# Patient Record
Sex: Male | Born: 1975 | Race: White | Hispanic: No | Marital: Single | Smoking: Current every day smoker
Health system: Southern US, Community
[De-identification: ages and names within clinical notes are randomized; demographics above are authoritative.]

## PROBLEM LIST (undated history)

## (undated) DIAGNOSIS — F32A Depression, unspecified: Secondary | ICD-10-CM

## (undated) DIAGNOSIS — F845 Asperger's syndrome: Secondary | ICD-10-CM

## (undated) DIAGNOSIS — F1010 Alcohol abuse, uncomplicated: Secondary | ICD-10-CM

## (undated) DIAGNOSIS — E785 Hyperlipidemia, unspecified: Secondary | ICD-10-CM

## (undated) DIAGNOSIS — F17200 Nicotine dependence, unspecified, uncomplicated: Secondary | ICD-10-CM

## (undated) HISTORY — DX: Depression, unspecified: F32.A

## (undated) HISTORY — DX: Hyperlipidemia, unspecified: E78.5

## (undated) HISTORY — PX: TONSILLECTOMY: SUR1361

## (undated) HISTORY — DX: Nicotine dependence, unspecified, uncomplicated: F17.200

---

## 2011-06-16 ENCOUNTER — Ambulatory Visit: Payer: BC Managed Care – PPO

## 2011-06-16 ENCOUNTER — Ambulatory Visit (INDEPENDENT_AMBULATORY_CARE_PROVIDER_SITE_OTHER): Payer: BC Managed Care – PPO | Admitting: Family Medicine

## 2011-06-16 VITALS — BP 108/70 | HR 93 | Temp 98.0°F | Resp 18 | Ht 68.0 in | Wt 138.2 lb

## 2011-06-16 DIAGNOSIS — R634 Abnormal weight loss: Secondary | ICD-10-CM

## 2011-06-16 DIAGNOSIS — R05 Cough: Secondary | ICD-10-CM

## 2011-06-16 DIAGNOSIS — Z720 Tobacco use: Secondary | ICD-10-CM

## 2011-06-16 DIAGNOSIS — R350 Frequency of micturition: Secondary | ICD-10-CM

## 2011-06-16 DIAGNOSIS — F17200 Nicotine dependence, unspecified, uncomplicated: Secondary | ICD-10-CM

## 2011-06-16 LAB — POCT URINALYSIS DIPSTICK
Bilirubin, UA: NEGATIVE
Blood, UA: NEGATIVE
Glucose, UA: NEGATIVE
Leukocytes, UA: NEGATIVE
Nitrite, UA: NEGATIVE
Protein, UA: NEGATIVE
Spec Grav, UA: 1.02
Urobilinogen, UA: 1
pH, UA: 7

## 2011-06-16 LAB — COMPREHENSIVE METABOLIC PANEL
Albumin: 4.4 g/dL (ref 3.5–5.2)
Alkaline Phosphatase: 57 U/L (ref 39–117)
BUN: 14 mg/dL (ref 6–23)
CO2: 33 mEq/L — ABNORMAL HIGH (ref 19–32)
Glucose, Bld: 78 mg/dL (ref 70–99)
Sodium: 141 mEq/L (ref 135–145)
Total Bilirubin: 0.3 mg/dL (ref 0.3–1.2)
Total Protein: 6.8 g/dL (ref 6.0–8.3)

## 2011-06-16 LAB — POCT CBC
HCT, POC: 45.4 % (ref 43.5–53.7)
Hemoglobin: 15.1 g/dL (ref 14.1–18.1)
Lymph, poc: 3.5 — AB (ref 0.6–3.4)
MCHC: 33.3 g/dL (ref 31.8–35.4)
MCV: 91.7 fL (ref 80–97)
POC Granulocyte: 6.2 (ref 2–6.9)
POC LYMPH PERCENT: 32.9 %L (ref 10–50)
RDW, POC: 13.7 %
WBC: 10.6 10*3/uL — AB (ref 4.6–10.2)

## 2011-06-16 LAB — TSH: TSH: 0.938 u[IU]/mL (ref 0.350–4.500)

## 2011-06-16 LAB — GLUCOSE, POCT (MANUAL RESULT ENTRY): POC Glucose: 77

## 2011-06-16 LAB — POCT SEDIMENTATION RATE: POCT SED RATE: 5 mm/hr (ref 0–22)

## 2011-06-16 NOTE — Patient Instructions (Signed)
Increase your caloric intake, with increased portions, mid morning and afternoon snack, and balanced diet including protein, fiber and complex carbohydrates.  Recheck in next 6 weeks, and can repeat blood counts at that time.  Return to the clinic or go to the nearest emergency room if any of your symptoms worsen or new symptoms occur, including any fever.  Your other lab results and xray report will be available in the next 7 to 10 days.

## 2011-06-16 NOTE — Progress Notes (Signed)
Subjective:    Patient ID: Christian Haley, male    DOB: Mar 30, 1975, 36 y.o.   MRN: [id number]  HPI Christian Haley is a 36 y.o. male Last office visit here 04/03/07.  At that time was having approximately Q 2 week drug screens as had history of alcohol dependence and was in vocational rehab with psychotherapist.    Today, patient with concern of weight loss, family mentioned this few days ago.  Weight 164.8 at 04/03/07 office visit *(138 today).  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 20-25 pounds weight loss over past few years. Sleeping well.  No night sweats, no fevers.  Occasional cough - few times per day, past several years.  Has had some pollen allergies in past. usually dry, no hemoptysis, rare mucus. Feels well overall.  Eats frozen foods mostly.  3 meals per day.  No recent dieting. Lives by self.  No recent alcohol - none since 2009. Prior heavy use.  Smokes 1 ppd for 20 years. No illicit drug use.   Rare aspirin for headache. 4 -5 cups coffee per day.  Has urinary frequency with increased coffee consumption, but no urinary symptoms otherwise.  Hx of autism/ high functioning (Aspergers).   Review of Systems  Constitutional: Positive for unexpected weight change. Negative for fever, chills, diaphoresis and fatigue.  HENT: Negative for hearing loss and trouble swallowing.   Eyes: Negative for visual disturbance.  Respiratory: Positive for cough. Negative for shortness of breath.   Cardiovascular: Negative for chest pain.  Gastrointestinal: Negative for abdominal pain, blood in stool and anal bleeding.  Genitourinary: Positive for frequency. Negative for difficulty urinating.  Hematological: Negative for adenopathy. Does not bruise/bleed easily.  Psychiatric/Behavioral: Negative for suicidal ideas, sleep disturbance and dysphoric mood. The patient is not nervous/anxious.        Objective:   Physical Exam  Constitutional: He is  oriented to person, place, and time. He appears well-developed. No distress.       Appears thin.  No distress.  HENT:  Head: Normocephalic and atraumatic.  Right Ear: External ear normal.  Left Ear: External ear normal.  Mouth/Throat: Oropharynx is clear and moist. No oropharyngeal exudate.  Eyes: Conjunctivae and EOM are normal. Pupils are equal, round, and reactive to light.  Neck: Normal range of motion. No thyromegaly present.  Cardiovascular: Normal rate, regular rhythm, normal heart sounds and intact distal pulses.   Pulmonary/Chest: Effort normal and breath sounds normal. No respiratory distress. He has no wheezes.  Abdominal: Soft. Normal appearance and bowel sounds are normal. He exhibits no distension. There is no hepatosplenomegaly. There is no tenderness. There is no rigidity and no guarding.  Musculoskeletal: He exhibits no edema.  Lymphadenopathy:    He has no cervical adenopathy.  Neurological: He is alert and oriented to person, place, and time.  Skin: Skin is warm and dry. No rash noted. No erythema. No pallor.  Psychiatric: He has a normal mood and affect. His behavior is normal.       Stutters during exam , but no acute findings.   [facility] reading (PRIMARY) by  Dr. Neva Seat: increased RLL>LLL markings without focal infiltrate.  Results for orders placed in visit on 06/16/11  POCT CBC      Component Value Range   WBC 10.6 (*) 4.6 - 10.2 (K/uL)   Lymph, poc 3.5 (*) 0.6 - 3.4    POC LYMPH PERCENT 32.9  10 - 50 (%L)   MID (  cbc) 1.0 (*) 0 - 0.9    POC MID % 9.0  0 - 12 (%M)   POC Granulocyte 6.2  2 - 6.9    Granulocyte percent 58.1  37 - 80 (%G)   RBC 4.95  4.69 - 6.13 (M/uL)   Hemoglobin 15.1  14.1 - 18.1 (g/dL)   HCT, POC 96.0  45.4 - 53.7 (%)   MCV 91.7  80 - 97 (fL)   MCH, POC 30.5  27 - 31.2 (pg)   MCHC 33.3  31.8 - 35.4 (g/dL)   RDW, POC 09.8     Platelet Count, POC 403  142 - 424 (K/uL)   MPV 8.4  0 - 99.8 (fL)  POCT URINALYSIS DIPSTICK      Component Value  Range   Color, UA yellow     Clarity, UA clear     Glucose, UA neg     Bilirubin, UA neg     Ketones, UA trace     Spec Grav, UA 1.020     Blood, UA neg     pH, UA 7.0     Protein, UA neg     Urobilinogen, UA 1.0     Nitrite, UA neg     Leukocytes, UA Negative    GLUCOSE, POCT (MANUAL RESULT ENTRY)      Component Value Range   POC Glucose 77         Assessment & Plan:  Christian Haley is a 36 y.o. male 1. Weight loss, unintentional  POCT CBC, Comprehensive metabolic panel, POCT SEDIMENTATION RATE, TSH, POCT glucose (manual entry)  2. Cough  DG Chest 2 View  3. Tobacco abuse  DG Chest 2 View  4. Urinary frequency  Comprehensive metabolic panel, POCT urinalysis dipstick, POCT glucose (manual entry)   Check CXR report with smoking history.  Sed rate, CMP, TSH.    Plan on increased caloric intake, with increased portions, mid morning and afternoon snack, and balanced diet including protein, fiber and complex carbohydrates.  Recheck in next 6 weeks, and can repeat CBC at that time.  RTC sooner if any new or worsening symptoms, including fever.  Recheck next 6 weeks.

## 2011-07-28 ENCOUNTER — Ambulatory Visit (INDEPENDENT_AMBULATORY_CARE_PROVIDER_SITE_OTHER): Payer: BC Managed Care – PPO | Admitting: Family Medicine

## 2011-07-28 VITALS — BP 96/62 | HR 99 | Temp 97.9°F | Resp 20 | Ht 68.38 in | Wt 140.6 lb

## 2011-07-28 DIAGNOSIS — F17200 Nicotine dependence, unspecified, uncomplicated: Secondary | ICD-10-CM

## 2011-07-28 DIAGNOSIS — R634 Abnormal weight loss: Secondary | ICD-10-CM

## 2011-07-28 DIAGNOSIS — Z720 Tobacco use: Secondary | ICD-10-CM

## 2011-07-28 DIAGNOSIS — D72829 Elevated white blood cell count, unspecified: Secondary | ICD-10-CM

## 2011-07-28 LAB — POCT CBC
Granulocyte percent: 58.5 %G (ref 37–80)
HCT, POC: 46.8 % (ref 43.5–53.7)
Hemoglobin: 15.2 g/dL (ref 14.1–18.1)
MPV: 7.7 fL (ref 0–99.8)
POC Granulocyte: 6.5 (ref 2–6.9)
POC MID %: 8.5 %M (ref 0–12)
RBC: 5.04 M/uL (ref 4.69–6.13)

## 2011-07-28 NOTE — Progress Notes (Signed)
Subjective:    Patient ID: Christian Haley, male    DOB: [birth year], 36 y.o.   MRN: [id number]  HPI Christian Haley is a 35 y.o. male Seen 06/16/11 with weight loss.  In summary - Weight 164.8 at 04/03/07 office visit *(138 last office visit).  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 20-25 pounds weight loss over past few years. Sleeping well.  No night sweats, no fevers.  Occasional cough - few times per day, past several years.  Has had some pollen allergies in past. usually dry, no hemoptysis, rare mucus. Feels well overall. Eats frozen foods mostly.  3 meals per day.  No recent dieting. Lives by self.  No recent alcohol - none since 2009. Prior heavy use.  Smokes 1 ppd for 20 years. No illicit drug use. Rare aspirin for headache. 4 -5 cups coffee per day.  Has urinary frequency with increased coffee consumption, but no urinary symptoms otherwise.   CXR report (smoking history:   Findings: Lung volumes are normal. No consolidative airspace  disease. No pleural effusions. No pneumothorax. No pulmonary  nodule or mass noted. Pulmonary vasculature and the  cardiomediastinal silhouette are within normal limits.  IMPRESSION:  1. No radiographic evidence of acute cardiopulmonary disease.   Sed rate, CMP, TSH drawn last ov :  Results for orders placed in visit on 06/16/11  POCT CBC      Component Value Range   WBC 10.6 (*) 4.6 - 10.2 (K/uL)   Lymph, poc 3.5 (*) 0.6 - 3.4    POC LYMPH PERCENT 32.9  10 - 50 (%L)   MID (cbc) 1.0 (*) 0 - 0.9    POC MID % 9.0  0 - 12 (%M)   POC Granulocyte 6.2  2 - 6.9    Granulocyte percent 58.1  37 - 80 (%G)   RBC 4.95  4.69 - 6.13 (M/uL)   Hemoglobin 15.1  14.1 - 18.1 (g/dL)   HCT, POC 40.9  81.1 - 53.7 (%)   MCV 91.7  80 - 97 (fL)   MCH, POC 30.5  27 - 31.2 (pg)   MCHC 33.3  31.8 - 35.4 (g/dL)   RDW, POC 91.4     Platelet Count, POC 403  142 - 424 (K/uL)   MPV 8.4  0 - 99.8 (fL)  COMPREHENSIVE METABOLIC  PANEL      Component Value Range   Sodium 141  135 - 145 (mEq/L)   Potassium 4.4  3.5 - 5.3 (mEq/L)   Chloride 101  96 - 112 (mEq/L)   CO2 33 (*) 19 - 32 (mEq/L)   Glucose, Bld 78  70 - 99 (mg/dL)   BUN 14  6 - 23 (mg/dL)   Creat 7.82  9.56 - 2.13 (mg/dL)   Total Bilirubin 0.3  0.3 - 1.2 (mg/dL)   Alkaline Phosphatase 57  39 - 117 (U/L)   AST 14  0 - 37 (U/L)   ALT 8  0 - 53 (U/L)   Total Protein 6.8  6.0 - 8.3 (g/dL)   Albumin 4.4  3.5 - 5.2 (g/dL)   Calcium 9.5  8.4 - 08.6 (mg/dL)  POCT SEDIMENTATION RATE      Component Value Range   POCT SED RATE 5  0 - 22 (mm/hr)  TSH      Component Value Range   TSH 0.938  0.350 - 4.500 (uIU/mL)  POCT URINALYSIS DIPSTICK      Component  Value Range   Color, UA yellow     Clarity, UA clear     Glucose, UA neg     Bilirubin, UA neg     Ketones, UA trace     Spec Grav, UA 1.020     Blood, UA neg     pH, UA 7.0     Protein, UA neg     Urobilinogen, UA 1.0     Nitrite, UA neg     Leukocytes, UA Negative    GLUCOSE, POCT (MANUAL RESULT ENTRY)      Component Value Range   POC Glucose 77     Planned on increased caloric intake, with increased portions, mid morning and afternoon snack, and balanced diet including protein, fiber and complex carbohydrates.   Her for recheck with repeat CBC.  Now eating 2 granola bars per day, no other changes.  Feels better with this. Has not increased calories.  Occasional fast food.  Occasional eggs in am, usually in fruit or microwaveable sausage biscuit.    Hx of high functioning Aspergers syndrome.  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   No recent illness.    Review of Systems  Constitutional: Negative for fever, chills and appetite change.  Respiratory: Negative for cough and shortness of breath.   Cardiovascular: Negative for chest pain.  Neurological: Negative for headaches.       Objective:   Physical Exam  Constitutional: He is oriented to person, place, and time. No distress.        Appears thin.  No distress.  HENT:  Head: Normocephalic and atraumatic.  Cardiovascular: Normal rate, regular rhythm, normal heart sounds and intact distal pulses.   No murmur heard. Pulmonary/Chest: Effort normal.  Neurological: He is alert and oriented to person, place, and time.       nonfocal.  Skin: Skin is warm and dry.  Psychiatric: He has a normal mood and affect. His behavior is normal.    Results for orders placed in visit on 07/28/11  POCT CBC      Component Value Range   WBC 11.1 (*) 4.6 - 10.2 (K/uL)   Lymph, poc 3.7 (*) 0.6 - 3.4    POC LYMPH PERCENT 33.0  10 - 50 (%L)   MID (cbc) 0.9  0 - 0.9    POC MID % 8.5  0 - 12 (%M)   POC Granulocyte 6.5  2 - 6.9    Granulocyte percent 58.5  37 - 80 (%G)   RBC 5.04  4.69 - 6.13 (M/uL)   Hemoglobin 15.2  14.1 - 18.1 (g/dL)   HCT, POC 16.1  09.6 - 53.7 (%)   MCV 92.8  80 - 97 (fL)   MCH, POC 30.2  27 - 31.2 (pg)   MCHC 32.5  31.8 - 35.4 (g/dL)   RDW, POC 04.5     Platelet Count, POC 378  142 - 424 (K/uL)   MPV 7.7  0 - 99.8 (fL)         Assessment & Plan:  [name] is a 36 y.o. male Wt loss, only up 2 pounds from last office visit, as only change is 2 granola bars.  Again discussed necessary caloric intake with his high activity job, and more nutritious diet in general with less fast food and processed/frozen food.  Recommended nutritionist consult, but he declined at this time.  Discussed if he changes his mind, we can refer there. He stated he would like to keep  his diet the way it is for now.  Recommended less processsed and fast food, more complex carbs and total calories. Discussed recheck in next 2-3 months. He plans on calling about this.   Borderline leukocytosis - check peripheral smear for review.  Tobacco abuse - still smoking 1 pack per day. No intention on quitting currently - advised him of dangers of smoking, and that we will be happy to help him with this when he is ready.

## 2011-07-29 LAB — PATHOLOGIST SMEAR REVIEW

## 2011-07-31 ENCOUNTER — Telehealth: Payer: Self-pay

## 2011-07-31 NOTE — Telephone Encounter (Signed)
Patient states he was returning call to [place]. Please call back.

## 2011-10-08 ENCOUNTER — Ambulatory Visit (INDEPENDENT_AMBULATORY_CARE_PROVIDER_SITE_OTHER): Payer: BC Managed Care – PPO | Admitting: Family Medicine

## 2011-10-08 ENCOUNTER — Encounter: Payer: Self-pay | Admitting: Family Medicine

## 2011-10-08 VITALS — BP 98/64 | HR 93 | Temp 98.3°F | Resp 16 | Ht 68.0 in | Wt 140.4 lb

## 2011-10-08 DIAGNOSIS — D7289 Other specified disorders of white blood cells: Secondary | ICD-10-CM

## 2011-10-08 DIAGNOSIS — D72829 Elevated white blood cell count, unspecified: Secondary | ICD-10-CM

## 2011-10-08 DIAGNOSIS — R634 Abnormal weight loss: Secondary | ICD-10-CM

## 2011-10-08 LAB — CBC WITH DIFFERENTIAL/PLATELET
Basophils Relative: 1 % (ref 0–1)
HCT: 44.1 % (ref 39.0–52.0)
Hemoglobin: 15.7 g/dL (ref 13.0–17.0)
Lymphocytes Relative: 29 % (ref 12–46)
Lymphs Abs: 2.8 10*3/uL (ref 0.7–4.0)
MCHC: 35.6 g/dL (ref 30.0–36.0)
Monocytes Relative: 8 % (ref 3–12)
Neutro Abs: 5.4 10*3/uL (ref 1.7–7.7)
Neutrophils Relative %: 55 % (ref 43–77)
RBC: 5.03 MIL/uL (ref 4.22–5.81)
WBC: 9.7 10*3/uL (ref 4.0–10.5)

## 2011-10-08 NOTE — Patient Instructions (Signed)
Continue to maintain good hydration and adequate calories to support your metabolism, including complex carbohydrates/fiber. Recheck in 5 months. Return to the clinic or go to the nearest emergency room if any of your symptoms worsen or new symptoms occur.

## 2011-10-08 NOTE — Progress Notes (Signed)
  Subjective:    Patient ID: Christian Haley, male    DOB: Sep 29, 1975, 36 y.o.   MRN: [id number]  HPI Christian Haley is a 36 y.o. male See last 2 ov's:  Weight loss - 164 in 2009, 138, then 140 at 5/13 office visit.  Active job, suspected too few calories to support.  Working on diet changes then. CMP, TSh, and CXR (hx of tobacco abuse ) WNL.  Has been able to cook more on own instead of frozen foods.  Feels better - more energy, eating healthier.  Cooking more seems to be working well.     Leukocytosis - 06/16/11: WBC 10.6, 07/28/11: 11.1, path review WNL, no fevers. No new symptoms.  No fever, no cough/hemoptysis/SOB.  NO dysuria or urinary difficulties. No new swollen lymph nodes.     Review of Systems  Hematological: Negative for adenopathy.   As above no new concerns.      Objective:   Physical Exam  Constitutional: He is oriented to person, place, and time. He appears well-developed and well-nourished.  HENT:  Head: Normocephalic.  Eyes: Pupils are equal, round, and reactive to light.  Neck: Normal range of motion. No thyromegaly present.  Cardiovascular: Normal rate, regular rhythm, normal heart sounds and intact distal pulses.   Pulmonary/Chest: Effort normal.  Abdominal: Soft. There is no tenderness.  Lymphadenopathy:    He has no cervical adenopathy.  Neurological: He is alert and oriented to person, place, and time.  Skin: Skin is warm.       No cervical, axillary, epitrochlear, or inguinal lymphadenopathy.   Psychiatric: He has a normal mood and affect. His behavior is normal.          Assessment & Plan:  Christian Haley is a 36 y.o. male 1. Weight loss    2. Leukocytosis  CBC with Differential   Wt loss - stable. Improved diet/healthier diet.  Continue to maintain adequate calorie intake to support active job, including complex carbs.    Leukocytosis - no fever or known infection.  Recheck level today.  Discussed maintaining hydration.   rtc precautions.

## 2012-03-10 ENCOUNTER — Ambulatory Visit: Payer: BC Managed Care – PPO | Admitting: Family Medicine

## 2012-03-13 ENCOUNTER — Ambulatory Visit (INDEPENDENT_AMBULATORY_CARE_PROVIDER_SITE_OTHER): Payer: BC Managed Care – PPO | Admitting: Family Medicine

## 2012-03-13 ENCOUNTER — Encounter: Payer: Self-pay | Admitting: Family Medicine

## 2012-03-13 VITALS — BP 100/62 | HR 87 | Temp 98.9°F | Resp 16 | Ht 68.5 in | Wt 139.0 lb

## 2012-03-13 DIAGNOSIS — Z720 Tobacco use: Secondary | ICD-10-CM

## 2012-03-13 DIAGNOSIS — Z23 Encounter for immunization: Secondary | ICD-10-CM

## 2012-03-13 DIAGNOSIS — Z713 Dietary counseling and surveillance: Secondary | ICD-10-CM

## 2012-03-13 DIAGNOSIS — F17200 Nicotine dependence, unspecified, uncomplicated: Secondary | ICD-10-CM

## 2012-03-13 NOTE — Patient Instructions (Signed)
Continue to work on diet changes as we discussed for your energy at work and health overall.   Decrease fast food and [employer]. Sandwich and vegetable at deli is a better option.  Increase complex carbohydrates with wheat breads and other fiber containing foods.  Eat snacks and increase in calories when increased activity or working more hours. Increase vegetables other than salad or lettuce.  Goal of 145 weight at next 4-5 months.  When you are ready to quit smoking, we are ready to help you with this.  It is recommended to quit smoking for your overall health and to decrease the risk for problems  including risks of cancers with smoking.   Recheck in the next 4-5 months.

## 2012-03-13 NOTE — Progress Notes (Signed)
Subjective:    Patient ID: Christian Haley, male    DOB: [birth year], 36 y.o.   MRN: [id number]  HPI  See prior ov's:  Weight loss - 164 in 2009, 138, then 140 at 5/13 office visit. Active job, suspected too few calories to support. Worked on diet changes then. CMP, TSh, and CXR (hx of tobacco abuse ) WNL. Had been able to cook more on own instead of frozen foods. Felt better - more energy, eating healthier. Cooking more seemed to be working well.  Improved with better calorie intake and discussions of more nutritious foods and complex carbs. Wt 140 last ov. Discussed nutritionist eval prior - but pt declined.   Weight 139 today - still working at [employer]. Busy at work.  Was able to spend thanksgiving and Christmas with family. Still cooking at home.  Ground beef, eggs, bacon and sausage.  Salad or lettuce usually as vegetable, some fruits at times. Potato rolls. Occasional wheat, eating more cereal. Eats [employer] at work during break.  Usually eating burgers and chicken sandwiches, french fries most of the time. 3 meals per day.  Not having snacks. Vacationing in [place] in few weeks.   Leukocytosis resolved last ov.   Results for orders placed in visit on 10/08/11  CBC WITH DIFFERENTIAL      Component Value Range   WBC 9.7  4.0 - 10.5 K/uL   RBC 5.03  4.22 - 5.81 MIL/uL   Hemoglobin 15.7  13.0 - 17.0 g/dL   HCT 14.7  82.9 - 56.2 %   MCV 87.7  78.0 - 100.0 fL   MCH 31.2  26.0 - 34.0 pg   MCHC 35.6  30.0 - 36.0 g/dL   RDW 13.0  86.5 - 78.4 %   Platelets 318  150 - 400 K/uL   Neutrophils Relative 55  43 - 77 %   Neutro Abs 5.4  1.7 - 7.7 K/uL   Lymphocytes Relative 29  12 - 46 %   Lymphs Abs 2.8  0.7 - 4.0 K/uL   Monocytes Relative 8  3 - 12 %   Monocytes Absolute 0.8  0.1 - 1.0 K/uL   Eosinophils Relative 7 (*) 0 - 5 %   Eosinophils Absolute 0.7  0.0 - 0.7 K/uL   Basophils Relative 1  0 - 1 %   Basophils Absolute 0.1  0.0 - 0.1 K/uL   Smear Review Criteria for review not met      Tobacco abuse - counseled prior ov's - not ready to quit.    Review of Systems  Constitutional: Positive for activity change (more active at work since thanksgiving. ). Negative for fever, chills, appetite change and unexpected weight change.  Respiratory: Negative for cough and shortness of breath.   Cardiovascular: Negative for chest pain.  Gastrointestinal: Negative for abdominal pain and blood in stool.       No dark or tarry stools.        Objective:   Physical Exam  Constitutional: He is oriented to person, place, and time. He appears well-developed. No distress.       Thin.   HENT:  Head: Normocephalic and atraumatic.  Cardiovascular: Normal rate, regular rhythm, normal heart sounds and intact distal pulses.   Pulmonary/Chest: Effort normal and breath sounds normal.  Abdominal: Soft. Bowel sounds are normal. There is no tenderness. There is no rebound and no guarding.  Neurological: He is alert and oriented to person, place, and time.  Skin: Skin is  warm and dry. No rash noted.  Psychiatric: He has a normal mood and affect. His behavior is normal.          Assessment & Plan:  [name] is a 36 y.o. male 1. Need for prophylactic vaccination and inoculation against influenza  Flu vaccine greater than or equal to 3yo preservative free IM  2. Dietary counseling and surveillance    3. Tobacco abuse      Wt loss/nutrition counseling. Commended on home cooking and incorporating more cereal into diet.  4 recommendations discussed today with verbal readback,: Advised to decrease fast food - plans on eating at deli. Increasing complex carbohydrates with wheat breads Snacks and increase in calories when increased activity or working more hours Increasing vegetables other than salad.   Goal of 145 weight at next 4-5 months. rtc precautions.  Tobacco abuse - not ready to quit.  Smoking about 19-20 cigarettes per day.   Flu vaccine given today.

## 2012-07-31 ENCOUNTER — Ambulatory Visit (INDEPENDENT_AMBULATORY_CARE_PROVIDER_SITE_OTHER): Payer: BC Managed Care – PPO | Admitting: Family Medicine

## 2012-07-31 ENCOUNTER — Encounter: Payer: Self-pay | Admitting: Family Medicine

## 2012-07-31 VITALS — BP 100/72 | HR 92 | Temp 98.3°F | Resp 16 | Ht 68.0 in | Wt 141.8 lb

## 2012-07-31 DIAGNOSIS — Z720 Tobacco use: Secondary | ICD-10-CM | POA: Insufficient documentation

## 2012-07-31 DIAGNOSIS — R634 Abnormal weight loss: Secondary | ICD-10-CM

## 2012-07-31 DIAGNOSIS — F1021 Alcohol dependence, in remission: Secondary | ICD-10-CM | POA: Insufficient documentation

## 2012-07-31 DIAGNOSIS — F17200 Nicotine dependence, unspecified, uncomplicated: Secondary | ICD-10-CM

## 2012-07-31 NOTE — Patient Instructions (Signed)
Continue to eat 3 meals per day, including complex carbohydrates, and healthy snacks in between meals as needed to maintain enough calories - especially when working. Keep up the good work on eating vegetables, but fresh or frozen provide a little better nutrition. Drink plenty of fluids (not soda) when working outside or during hot weather. Recheck in 3 to 6 months for a physical.  Return to the clinic or go to the nearest emergency room if any of your symptoms worsen or new symptoms occur.

## 2012-07-31 NOTE — Progress Notes (Signed)
Subjective:    Patient ID: Christian Haley, male    DOB: March 21, 1975, 37 y.o.   MRN: [id number]  HPI Christian Haley is a 37 y.o. male  Hx of weight loss, see prior ov's. In summary -  Weight 164 in 2009,  140 at 5/13 office visit. Active job, working at [employer].  suspected too few calories to support. Worked on diet changes then. CMP, TSh, and CXR (hx of tobacco abuse ) WNL. Had been able to cook more on own instead of frozen foods. Felt better - more energy, eating healthier. Cooking more seemed to be working well.  Improved with better calorie intake and discussions of more nutritious foods and complex carbs. Discussed nutritionist eval prior - but pt declined. Discussed complex carbs/fiber, increase in vegetables, less fast food and snacks if needed to support energy use during more exertional activities at 03/13/12 ov.   Weight 141 today. Eating other vegetables now - canned green beans and corn. Also eating whole wheat bread.  Feels like eating better meals, food from the deli. Less fast food - now about 1-2x per week at the most.  Eating 3 meals per day. Sometimes eats a granola bar, but not usually as a snack.  Feels like has enough.  No lightheadedness/dizziness. Does drink soda  - 1 per day. No blood in stool, no dark/tarry stools.  No new cough.   Tobacco abuse - not interested in cessation prior. Still smoking up to a pack a day. Not ready to quit at this point.   Alcoholism - recovered alcoholic in 2009, no recent alcohol.   Review of Systems  Constitutional: Negative for chills, fatigue and unexpected weight change.  Eyes: Negative for visual disturbance.  Respiratory: Negative for cough, chest tightness and shortness of breath.   Cardiovascular: Negative for chest pain.  Gastrointestinal: Negative for abdominal pain, blood in stool and anal bleeding.  Skin: Negative for rash.  Neurological: Negative for dizziness and light-headedness.  Psychiatric/Behavioral: Negative for dysphoric  mood. The patient is not nervous/anxious.        Objective:   Physical Exam  Constitutional: He is oriented to person, place, and time. He appears well-developed and well-nourished.  HENT:  Head: Normocephalic and atraumatic.  Eyes: EOM are normal. Pupils are equal, round, and reactive to light.  Neck: No JVD present. Carotid bruit is not present.  Cardiovascular: Normal rate, regular rhythm and normal heart sounds.   No murmur heard. Pulmonary/Chest: Effort normal and breath sounds normal. He has no rales.  Abdominal: Soft. Bowel sounds are normal. There is no hepatosplenomegaly. There is no tenderness. No hernia.  Thin.   Musculoskeletal: He exhibits no edema.  Neurological: He is alert and oriented to person, place, and time.  Skin: Skin is warm and dry.  Psychiatric: He has a normal mood and affect. His behavior is normal.       Assessment & Plan:  Christian Haley is a 37 y.o. male Tobacco abuse  Loss of weight  Weight loss - stabilized/improving now. Nutritional counseling - commended on efforts, discussed fresh or frozen vegetables preferable, continue complex carbs, increase healthy snacks during workday as needed if increased activity, and maintain adequate hydration.   Hx of alcoholism - in recovery, no recent alcohol. Recovered alcoholic by hx.   Tobacco abuse - discussed cessation again, and when he is ready - can rtc to discuss cessation options. Understanding expressed.   Plan on cpe next 3-6 months.   Patient Instructions  Continue to eat  3 meals per day, including complex carbohydrates, and healthy snacks in between meals as needed to maintain enough calories - especially when working. Keep up the good work on eating vegetables, but fresh or frozen provide a little better nutrition. Drink plenty of fluids (not soda) when working outside or during hot weather. Recheck in 3 to 6 months for a physical.  Return to the clinic or go to the nearest emergency room if any of  your symptoms worsen or new symptoms occur.

## 2013-01-29 ENCOUNTER — Ambulatory Visit (INDEPENDENT_AMBULATORY_CARE_PROVIDER_SITE_OTHER): Payer: BC Managed Care – PPO | Admitting: Family Medicine

## 2013-01-29 ENCOUNTER — Encounter: Payer: Self-pay | Admitting: Family Medicine

## 2013-01-29 VITALS — BP 120/74 | HR 101 | Temp 98.4°F | Resp 20 | Ht 68.0 in | Wt 151.0 lb

## 2013-01-29 DIAGNOSIS — Z716 Tobacco abuse counseling: Secondary | ICD-10-CM

## 2013-01-29 DIAGNOSIS — Z23 Encounter for immunization: Secondary | ICD-10-CM

## 2013-01-29 DIAGNOSIS — F17200 Nicotine dependence, unspecified, uncomplicated: Secondary | ICD-10-CM

## 2013-01-29 DIAGNOSIS — R634 Abnormal weight loss: Secondary | ICD-10-CM

## 2013-01-29 DIAGNOSIS — Z7189 Other specified counseling: Secondary | ICD-10-CM

## 2013-01-29 DIAGNOSIS — F1011 Alcohol abuse, in remission: Secondary | ICD-10-CM

## 2013-01-29 NOTE — Patient Instructions (Signed)
Continue to eat 3 meals per day, snacks when working and keep up the good work with fresh vegetables for balanced diet.  Recheck in 6 months for a physical.  When you are ready to quit smoking - [facility] offers smoking cessation clinics. Registration is required. To register call [phone] or register online at [email or web address].

## 2013-01-29 NOTE — Progress Notes (Signed)
Subjective:    Patient ID: Christian Haley, male    DOB: 06/12/1975, 37 y.o.   MRN: [id number]  HPI Christian Haley is a 37 y.o. male  Hx of weight loss.  In summary -  Weight 164 in 2009,  140 at 5/13 office visit. Active job, working at [employer].  suspected too few calories to support. Worked on diet changes then. CMP, TSh, and CXR (hx of tobacco abuse ) WNL. Had been able to cook more on own instead of frozen foods. Felt better - more energy, eating healthier. Cooking more seemed to be working well.  Improved with better calorie intake and discussions of more nutritious foods and complex carbs. Discussed nutritionist eval prior - but pt declined. Discussed complex carbs/fiber, increase in vegetables, less fast food and snacks if needed to support energy use during more exertional activities prior.  3 meals per day and snacks when needed.   Weight up to 151 today from 141 in May of this year. Still working at [employer]. Same job, 3 meals per day, eating some more fresh vegetables. Snacks depend on work schedule. Drinking fluids throughout day.  Occasional soda only on some work days - few per week. Sunday dinners - eats well. Feels like cooking on stove has helped.   Tobacco abuse - not interested in cessation prior. Still smoking up to a pack a day. Not ready to quit when discussed/recommended prior.  Still not ready to quit.   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  None since since 2009.   Plan for CPE, but just here for follow up visit today.   Had flu vaccine today.    Patient Active Problem List   Diagnosis Date Noted  . Tobacco abuse 07/31/2012  . History of alcoholism 07/31/2012   No past medical history on file. No past surgical history on file. No Known Allergies Prior to Admission medications   Not on File   History   Social History  . Marital Status: Single    Spouse Name: N/A    Number of Children: N/A  . Years of Education: N/A    Occupational History  . Not on file.   Social History Main Topics  . Smoking status: Current Every Day Smoker -- 1.00 packs/day for 20 years    Types: Cigarettes  . Smokeless tobacco: Not on file  . Alcohol Use: Not on file  . Drug Use: Not on file  . Sexual Activity: Not on file   Other Topics Concern  . Not on file   Social History Narrative  . No narrative on file    Review of Systems  Constitutional: Negative for fever, chills, fatigue and unexpected weight change.  Respiratory: Negative for cough (rare, no blood, no recent changes. ) and shortness of breath.   Cardiovascular: Negative for chest pain.       Objective:   Physical Exam  Vitals reviewed. Constitutional: He is oriented to person, place, and time. He appears well-developed and well-nourished. No distress.  Thin, but not cachectic.   HENT:  Head: Normocephalic and atraumatic.  Eyes: EOM are normal. Pupils are equal, round, and reactive to light.  Neck: No JVD present. Carotid bruit is not present.  Cardiovascular: Normal rate, regular rhythm and normal heart sounds.   No murmur heard. Pulmonary/Chest: Effort normal and breath sounds normal. He has no rales.  Musculoskeletal: He exhibits no edema.  Neurological: He is alert and oriented to person, place, and  time.  Skin: Skin is warm and dry. He is not diaphoretic.  Psychiatric: He has a normal mood and affect.   Filed Vitals:   01/29/13 1338  BP: 120/74  Pulse: 101  Temp: 98.4 F (36.9 C)  TempSrc: Oral  Resp: 20  Height: 5\' 8"  (1.727 m)  Weight: 151 lb (68.493 kg)  SpO2: 100%      Assessment & Plan:  Christian Haley is a 37 y.o. male Need for prophylactic vaccination and inoculation against influenza - Plan: Flu Vaccine QUAD 36+ mos IM - given.   Loss of weight - improved.  Up 10 pounds from prior and encouraged on incorporation of fresh foods in diet.   Alcohol abuse, in remission - maintained sobriety - encouraged on continuing  this.  Tobacco abuse counseling - risks to health discussed, not limited to appetite suppression, lung disease, cancers. Understanding expressed but precontemplator at present. Resources given if interested.   Plan on CPE in May.   No orders of the defined types were placed in this encounter.   Patient Instructions  Continue to eat 3 meals per day, snacks when working and keep up the good work with fresh vegetables for balanced diet.  Recheck in 6 months for a physical.  When you are ready to quit smoking - [facility] offers smoking cessation clinics. Registration is required. To register call [phone] or register online at [email or web address].

## 2013-01-30 NOTE — Progress Notes (Signed)
Physical made for 07/30/13 with Dr Neva Seat.

## 2013-07-30 ENCOUNTER — Encounter: Payer: Self-pay | Admitting: Family Medicine

## 2013-07-30 ENCOUNTER — Ambulatory Visit (INDEPENDENT_AMBULATORY_CARE_PROVIDER_SITE_OTHER): Payer: BC Managed Care – PPO | Admitting: Family Medicine

## 2013-07-30 VITALS — BP 98/60 | HR 98 | Temp 98.2°F | Resp 16 | Ht 68.25 in | Wt 156.8 lb

## 2013-07-30 DIAGNOSIS — F17200 Nicotine dependence, unspecified, uncomplicated: Secondary | ICD-10-CM

## 2013-07-30 DIAGNOSIS — Z720 Tobacco use: Secondary | ICD-10-CM

## 2013-07-30 DIAGNOSIS — Z0000 Encounter for general adult medical examination without abnormal findings: Secondary | ICD-10-CM

## 2013-07-30 DIAGNOSIS — Z716 Tobacco abuse counseling: Secondary | ICD-10-CM

## 2013-07-30 DIAGNOSIS — F1011 Alcohol abuse, in remission: Secondary | ICD-10-CM

## 2013-07-30 DIAGNOSIS — Z7189 Other specified counseling: Secondary | ICD-10-CM

## 2013-07-30 LAB — POCT URINALYSIS DIPSTICK
BILIRUBIN UA: NEGATIVE
GLUCOSE UA: NEGATIVE
KETONES UA: NEGATIVE
LEUKOCYTES UA: NEGATIVE
NITRITE UA: NEGATIVE
PH UA: 8
Protein, UA: NEGATIVE
RBC UA: NEGATIVE
Spec Grav, UA: 1.01
Urobilinogen, UA: 0.2

## 2013-07-30 LAB — COMPLETE METABOLIC PANEL WITH GFR
ALT: 9 U/L (ref 0–53)
AST: 17 U/L (ref 0–37)
Albumin: 4.8 g/dL (ref 3.5–5.2)
Alkaline Phosphatase: 59 U/L (ref 39–117)
BILIRUBIN TOTAL: 0.5 mg/dL (ref 0.2–1.2)
BUN: 6 mg/dL (ref 6–23)
CALCIUM: 9.7 mg/dL (ref 8.4–10.5)
CHLORIDE: 98 meq/L (ref 96–112)
CO2: 29 meq/L (ref 19–32)
CREATININE: 0.73 mg/dL (ref 0.50–1.35)
GLUCOSE: 83 mg/dL (ref 70–99)
Potassium: 4.6 mEq/L (ref 3.5–5.3)
Sodium: 136 mEq/L (ref 135–145)
Total Protein: 7.4 g/dL (ref 6.0–8.3)

## 2013-07-30 LAB — CBC
HEMATOCRIT: 44.5 % (ref 39.0–52.0)
HEMOGLOBIN: 15.8 g/dL (ref 13.0–17.0)
MCH: 31.9 pg (ref 26.0–34.0)
MCHC: 35.5 g/dL (ref 30.0–36.0)
MCV: 89.7 fL (ref 78.0–100.0)
Platelets: 361 10*3/uL (ref 150–400)
RBC: 4.96 MIL/uL (ref 4.22–5.81)
RDW: 13.3 % (ref 11.5–15.5)
WBC: 10.2 10*3/uL (ref 4.0–10.5)

## 2013-07-30 LAB — LIPID PANEL
Cholesterol: 192 mg/dL (ref 0–200)
HDL: 74 mg/dL (ref >39)
LDL CALC: 98 mg/dL (ref 0–99)
TRIGLYCERIDES: 99 mg/dL (ref <150)
Total CHOL/HDL Ratio: 2.6 Ratio
VLDL: 20 mg/dL (ref 0–40)

## 2013-07-30 NOTE — Patient Instructions (Signed)
You should receive a call or letter about your lab results within the next week to 10 days.   Continue healthy meals with protein, complex carbohydrate/fiber and fruits and vegetable throughout the day. Healthy snacks as needed at work.   When you are ready to quit smoking, let me know and I will be happy to help.   Keeping you healthy  Get these tests  Blood pressure- Have your blood pressure checked once a year by your healthcare provider.  Normal blood pressure is 120/80.  Weight- Have your body mass index (BMI) calculated to screen for obesity.  BMI is a measure of body fat based on height and weight. You can also calculate your own BMI at [employer]velBags.[email or web address].  Cholesterol- Have your cholesterol checked regularly starting at age 55, sooner may be necessary if you have diabetes, high blood pressure, if a family member developed heart diseases at an early age or if you smoke.   Chlamydia, HIV, and other sexual transmitted disease- Get screened each year until the age of 29 then within three months of each new sexual partner.  Diabetes- Have your blood sugar checked regularly if you have high blood pressure, high cholesterol, a family history of diabetes or if you are overweight.  Get these vaccines  Flu shot- Every fall.  Tetanus shot- Every 10 years.  Menactra- Single dose; prevents meningitis.  Take these steps  Don't smoke- If you do smoke, ask your healthcare provider about quitting. For tips on how to quit, go to www.smokefree.gov or call 1-800-QUIT-NOW.  Be physically active- Exercise 5 days a week for at least 30 minutes.  If you are not already physically active start slow and gradually work up to 30 minutes of moderate physical activity.  Examples of moderate activity include walking briskly, mowing the yard, dancing, swimming bicycling, etc.  Eat a healthy diet- Eat a variety of healthy foods such as fruits, vegetables, low fat milk, low fat cheese, yogurt, lean  meats, poultry, fish, beans, tofu, etc.  For more information on healthy eating, go to www.thenutritionsource.org  Drink alcohol in moderation- Limit alcohol intake two drinks or less a day.  Never drink and drive.  Dentist- Brush and floss teeth twice daily; visit your dentis twice a year.  Depression-Your emotional health is as important as your physical health.  If you're feeling down, losing interest in things you normally enjoy please talk with your healthcare provider.  Gun Safety- If you keep a gun in your home, keep it unloaded and with the safety lock on.  Bullets should be stored separately.  Helmet use- Always wear a helmet when riding a motorcycle, bicycle, rollerblading or skateboarding.  Safe sex- If you may be exposed to a sexually transmitted infection, use a condom  Seat belts- Seat bels can save your life; always wear one.  Smoke/Carbon Monoxide detectors- These detectors need to be installed on the appropriate level of your home.  Replace batteries at least once a year.  Skin Cancer- When out in the sun, cover up and use sunscreen SPF 15 or higher.  Violence- If anyone is threatening or hurting you, please tell your healthcare provider.

## 2013-07-30 NOTE — Progress Notes (Signed)
Subjective:   This chart was scribed for Christian Agreste, MD, by Neta Ehlers, ED Scribe. This patient's care was started at 2:37 PM.   Patient ID: Christian Haley, male    DOB: [birth year], 38 y.o.   MRN: [id number]  Chief Complaint  Patient presents with  . Annual Exam    HPI  Christian Haley is a 38 y.o. male who presents to [facility] for a physical exam. H/o tobacco use. Not interested in cessation when discussed at prior office visits. Current and recovered alcoholic in 2025. Seen in past for weight loss. See prior notes. Improved with increased calorie intake during the day as he has a very active job. Weight is up today from 151 pounds to 156 pounds since November of 2014.   Health maintenance: He reports he is eating multiple times a day with snacks between meals. Today, he had orange juice, a granola bar, and strawberries at 9 am. At noon he had a glass of water and a couple of tomatoes.   Tobacco: He reports he smokes a pack a day. The pt states he is not ready to quit smoking.   Alcohol: The pt reports February 2009 was his last alcoholic drink. He is maintaining sobriety.   Exercise: The pt is very active at work.   Dentist: The pt last visited a dentist two months ago. He reports he visits the dentist every six months.   Optometrist: The pt does not have a current optometrist.   T-dap/Tetanus: 2006 was his last booster.   Flu Vaccine given on November 2014.   He reports a h/o allergies. He has not used OTC medication for allergies.   He is not currently sexually active. He denies a need for STD assessment.   The pt reports he works at  [employer] at [employer]. He states he occasionally has a very full work schedule. He is happy with the new job.   PCP: Christian Agreste, MD  Patient Active Problem List   Diagnosis Date Noted  . Tobacco abuse 07/31/2012  . History of alcoholism 07/31/2012   No past medical history on file.  No past surgical history on file.  No  Known Allergies  Prior to Admission medications   Not on File    Review of Systems  ROS per patient survey. See nursing note.     Objective:   Physical Exam  HENT:  Mouth/Throat: Oropharynx is clear and moist.  Both frontal and maxillary sinuses non-tender. Moist mucosal membranes.   Cardiovascular: Normal rate, regular rhythm, normal heart sounds and intact distal pulses.  Exam reveals no friction rub.   No murmur heard. Pulmonary/Chest: Effort normal and breath sounds normal. No respiratory distress. He has no wheezes. He has no rales.  Musculoskeletal: He exhibits no edema.    Filed Vitals:   07/30/13 1402  BP: 98/60  Pulse: 98  Temp: 98.2 F (36.8 C)  TempSrc: Oral  Resp: 16  Height: 5' 8.25" (1.734 m)  Weight: 156 lb 12.8 oz (71.124 kg)  SpO2: 96%     Visual Acuity Screening   Right eye Left eye Both eyes  Without correction: 20/20 20/20 20/20   With correction:      POCT URINALYSIS DIPSTICK      Result Value Ref Range   Color, UA yellow     Clarity, UA clear     Glucose, UA neg     Bilirubin, UA neg     Ketones, UA neg  Spec Grav, UA 1.010     Blood, UA neg     pH, UA 8.0     Protein, UA neg     Urobilinogen, UA 0.2     Nitrite, UA neg     Leukocytes, UA Negative         Assessment & Plan:   Christian Haley is a 38 y.o. male Annual physical exam - Plan: COMPLETE METABOLIC PANEL WITH GFR, Lipid panel, POCT urinalysis dipstick, CBC  -screening labs above, and anticipatory guidance per handout. Discussed healthy diet with sufficient calorie intake - including source of protein during day.   Alcohol abuse, in remission - stable, commended on maintenance of sobriety.   Tobacco abuse counseling - again discussed potential harms and importance of cessation, but not ready to quit yet.   No orders of the defined types were placed in this encounter.   Patient Instructions  You should receive a call or letter about your lab results within the next week  to 10 days.   Continue healthy meals with protein, complex carbohydrate/fiber and fruits and vegetable throughout the day. Healthy snacks as needed at work.   When you are ready to quit smoking, let me know and I will be happy to help.   Keeping you healthy  Get these tests  Blood pressure- Have your blood pressure checked once a year by your healthcare provider.  Normal blood pressure is 120/80.  Weight- Have your body mass index (BMI) calculated to screen for obesity.  BMI is a measure of body fat based on height and weight. You can also calculate your own BMI at [email or web address].  Cholesterol- Have your cholesterol checked regularly starting at age 23, sooner may be necessary if you have diabetes, high blood pressure, if a family member developed heart diseases at an early age or if you smoke.   Chlamydia, HIV, and other sexual transmitted disease- Get screened each year until the age of 68 then within three months of each new sexual partner.  Diabetes- Have your blood sugar checked regularly if you have high blood pressure, high cholesterol, a family history of diabetes or if you are overweight.  Get these vaccines  Flu shot- Every fall.  Tetanus shot- Every 10 years.  Menactra- Single dose; prevents meningitis.  Take these steps  Don't smoke- If you do smoke, ask your healthcare provider about quitting. For tips on how to quit, go to www.smokefree.gov or call 1-800-QUIT-NOW.  Be physically active- Exercise 5 days a week for at least 30 minutes.  If you are not already physically active start slow and gradually work up to 30 minutes of moderate physical activity.  Examples of moderate activity include walking briskly, mowing the yard, dancing, swimming bicycling, etc.  Eat a healthy diet- Eat a variety of healthy foods such as fruits, vegetables, low fat milk, low fat cheese, yogurt, lean meats, poultry, fish, beans, tofu, etc.  For more information on healthy eating,  go to www.thenutritionsource.org  Drink alcohol in moderation- Limit alcohol intake two drinks or less a day.  Never drink and drive.  Dentist- Brush and floss teeth twice daily; visit your dentis twice a year.  Depression-Your emotional health is as important as your physical health.  If you're feeling down, losing interest in things you normally enjoy please talk with your healthcare provider.  Gun Safety- If you keep a gun in your home, keep it unloaded and with the safety lock on.  Bullets should be stored separately.  Helmet use- Always wear a helmet when riding a motorcycle, bicycle, rollerblading or skateboarding.  Safe sex- If you may be exposed to a sexually transmitted infection, use a condom  Seat belts- Seat bels can save your life; always wear one.  Smoke/Carbon Monoxide detectors- These detectors need to be installed on the appropriate level of your home.  Replace batteries at least once a year.  Skin Cancer- When out in the sun, cover up and use sunscreen SPF 15 or higher.  Violence- If anyone is threatening or hurting you, please tell your healthcare provider.    I personally performed the services described in this documentation, which was scribed in my presence. The recorded information has been reviewed and considered, and addended by me as needed.

## 2013-08-08 ENCOUNTER — Encounter: Payer: Self-pay | Admitting: *Deleted

## 2013-08-10 ENCOUNTER — Telehealth: Payer: Self-pay

## 2013-08-10 NOTE — Telephone Encounter (Signed)
Pt notified of labs and that a letter was sent

## 2013-08-10 NOTE — Telephone Encounter (Signed)
PATIENT STATES HE HAD A COMPLETE PE DONE WITH DR. Carlota Raspberry ON Jul 30, 2013. HE WAS TOLD HE WOULD RECEIVE A LETTER IN THE MAIL OR A PHONE CALL REGARDING THE RESULTS OF HIS URINE TEST AND LAB WORK. HE HAS NOT RECEIVED EITHER ONE. BEST PHONE [phone] (HOME)   [place].   [place]

## 2013-10-04 ENCOUNTER — Encounter (HOSPITAL_COMMUNITY): Payer: Self-pay | Admitting: Emergency Medicine

## 2013-10-04 ENCOUNTER — Emergency Department (HOSPITAL_COMMUNITY)
Admission: EM | Admit: 2013-10-04 | Discharge: 2013-10-04 | Disposition: A | Discharge status: Home / Self Care | Payer: BC Managed Care – PPO | Attending: Emergency Medicine | Admitting: Emergency Medicine

## 2013-10-04 DIAGNOSIS — F17200 Nicotine dependence, unspecified, uncomplicated: Secondary | ICD-10-CM | POA: Insufficient documentation

## 2013-10-04 DIAGNOSIS — F1010 Alcohol abuse, uncomplicated: Secondary | ICD-10-CM

## 2013-10-04 HISTORY — DX: Alcohol abuse, uncomplicated: F10.10

## 2013-10-04 HISTORY — DX: Asperger's syndrome: F84.5

## 2013-10-04 LAB — COMPREHENSIVE METABOLIC PANEL
ALBUMIN: 4.9 g/dL (ref 3.5–5.2)
ALT: 27 U/L (ref 0–53)
AST: 43 U/L — ABNORMAL HIGH (ref 0–37)
Alkaline Phosphatase: 78 U/L (ref 39–117)
Anion gap: 25 — ABNORMAL HIGH (ref 5–15)
BILIRUBIN TOTAL: 0.8 mg/dL (ref 0.3–1.2)
BUN: 8 mg/dL (ref 6–23)
CO2: 22 meq/L (ref 19–32)
CREATININE: 0.64 mg/dL (ref 0.50–1.35)
Calcium: 9.8 mg/dL (ref 8.4–10.5)
Chloride: 84 mEq/L — ABNORMAL LOW (ref 96–112)
GFR calc Af Amer: >90 mL/min (ref >90)
GFR calc non Af Amer: >90 mL/min (ref >90)
Glucose, Bld: 60 mg/dL — ABNORMAL LOW (ref 70–99)
Potassium: 4.2 mEq/L (ref 3.7–5.3)
SODIUM: 131 meq/L — AB (ref 137–147)
TOTAL PROTEIN: 8.5 g/dL — AB (ref 6.0–8.3)

## 2013-10-04 LAB — CBC
HCT: 47.8 % (ref 39.0–52.0)
Hemoglobin: 17.3 g/dL — ABNORMAL HIGH (ref 13.0–17.0)
MCH: 32.2 pg (ref 26.0–34.0)
MCHC: 36.2 g/dL — ABNORMAL HIGH (ref 30.0–36.0)
MCV: 88.8 fL (ref 78.0–100.0)
Platelets: 370 10*3/uL (ref 150–400)
RBC: 5.38 MIL/uL (ref 4.22–5.81)
RDW: 12.5 % (ref 11.5–15.5)
WBC: 8.9 10*3/uL (ref 4.0–10.5)

## 2013-10-04 LAB — SALICYLATE LEVEL: Salicylate Lvl: <2 mg/dL — ABNORMAL LOW (ref 2.8–20.0)

## 2013-10-04 LAB — RAPID URINE DRUG SCREEN, HOSP PERFORMED
Amphetamines: NOT DETECTED
BARBITURATES: NOT DETECTED
Benzodiazepines: NOT DETECTED
COCAINE: NOT DETECTED
Opiates: NOT DETECTED
Tetrahydrocannabinol: NOT DETECTED

## 2013-10-04 LAB — ETHANOL: ALCOHOL ETHYL (B): 153 mg/dL — AB (ref 0–11)

## 2013-10-04 LAB — ACETAMINOPHEN LEVEL: Acetaminophen (Tylenol), Serum: <15 ug/mL (ref 10–30)

## 2013-10-04 MED ORDER — LORAZEPAM 1 MG PO TABS: 0.0000 mg | ORAL_TABLET | Freq: Two times a day (BID) | ORAL | Status: DC

## 2013-10-04 MED ORDER — LORAZEPAM 1 MG PO TABS: 0.0000 mg | ORAL_TABLET | Freq: Four times a day (QID) | ORAL | Status: DC

## 2013-10-04 MED ORDER — THIAMINE HCL 100 MG/ML IJ SOLN: 100.0000 mg | Freq: Every day | INTRAMUSCULAR | Status: DC

## 2013-10-04 MED ORDER — VITAMIN B-1 100 MG PO TABS
100.0000 mg | ORAL_TABLET | Freq: Every day | ORAL | Status: DC
  Administered 2013-10-04: 100 mg via ORAL
  Filled 2013-10-04: qty 1

## 2013-10-04 NOTE — ED Provider Notes (Signed)
CSN: [id number]     Arrival date & time 10/04/13  1700 History   First MD Initiated Contact with Patient 10/04/13 0740     Chief Complaint  Patient presents with  . Alcohol Problem     (Consider location/radiation/quality/duration/timing/severity/associated sxs/prior Treatment) Patient is a 38 y.o. male presenting with drug/alcohol assessment.  Drug / Alcohol Assessment Similar prior episodes: yes   Severity:  Severe Onset quality:  Gradual Timing:  Constant Progression:  Worsening Chronicity:  Recurrent Suspected agents:  Alcohol Associated symptoms: no abdominal pain, no nausea and no seizures     Past Medical History  Diagnosis Date  . Asperger syndrome   . Alcohol abuse    History reviewed. No pertinent past surgical history. History reviewed. No pertinent family history. History  Substance Use Topics  . Smoking status: Current Every Day Smoker -- 1.00 packs/day for 20 years    Types: Cigarettes  . Smokeless tobacco: Not on file  . Alcohol Use: Yes     Comment: 1-4 days a week    Review of Systems  Gastrointestinal: Negative for nausea and abdominal pain.  Neurological: Negative for seizures.  All other systems reviewed and are negative.     Allergies  Review of patient's allergies indicates no known allergies.  Home Medications   Prior to Admission medications   Not on File   BP 111/83  Pulse 118  Temp(Src) 98.1 F (36.7 C) (Oral)  Resp 20  SpO2 100% Physical Exam  Nursing note and vitals reviewed. Constitutional: He is oriented to person, place, and time. He appears well-developed and well-nourished. No distress.  HENT:  Head: Normocephalic and atraumatic.  Eyes: Conjunctivae are normal. No scleral icterus.  Neck: Neck supple.  Cardiovascular: Normal rate and intact distal pulses.   Pulmonary/Chest: Effort normal. No stridor. No respiratory distress.  Abdominal: Normal appearance. He exhibits no distension.  Neurological: He is alert and  oriented to person, place, and time.  Skin: Skin is warm and dry. No rash noted.  Psychiatric: He has a normal mood and affect. His behavior is normal.    ED Course  Procedures (including critical care time) Labs Review Labs Reviewed  CBC - Abnormal; Notable for the following:    Hemoglobin 17.3 (*)    MCHC 36.2 (*)    All other components within normal limits  COMPREHENSIVE METABOLIC PANEL - Abnormal; Notable for the following:    Sodium 131 (*)    Chloride 84 (*)    Glucose, Bld 60 (*)    Total Protein 8.5 (*)    AST 43 (*)    Anion gap 25 (*)    All other components within normal limits  ETHANOL - Abnormal; Notable for the following:    Alcohol, Ethyl (B) 153 (*)    All other components within normal limits  SALICYLATE LEVEL - Abnormal; Notable for the following:    Salicylate Lvl <1.7 (*)    All other components within normal limits  ACETAMINOPHEN LEVEL  URINE RAPID DRUG SCREEN (HOSP PERFORMED)    Imaging Review No results found.   EKG Interpretation None      MDM   Final diagnoses:  Alcohol abuse    38 yo male with hx of asperger's syndrome presenting requesting detox from alcohol.  He reports suffering from hallucinations in the past when withdrawing.  He has no other complaints other than being hungry.  Last drink was wine last night.  He reports missing work for several days due to his  alcohol abuse.  Plan TTS consult.      Houston Siren III, MD 10/05/13 [phone]

## 2013-10-04 NOTE — ED Notes (Addendum)
Pt has come out of room multiple times, instructed each time to go back to room. Pt put back on his clothes and cam out of room, stated he needed to stretch his legs. Instructed to go back to room.  Pt walked back into room after a minute

## 2013-10-04 NOTE — Progress Notes (Signed)
CSW provided outpatient SA resources. Pt stated he would follow up. Pt stated alcohol was affecting his work life and his ability to attend work. CSW provided supportive counseling.  Rochele Pages,     ED CSW  phone: [phone] 11:15am

## 2013-10-04 NOTE — Consult Note (Signed)
  Review of Systems  Constitutional: Negative.   HENT: Negative.   Eyes: Negative.   Respiratory: Negative.   Cardiovascular: Negative.   Gastrointestinal: Negative.   Genitourinary: Negative.   Musculoskeletal: Negative.   Skin: Negative.   Neurological: Negative.   Endo/Heme/Allergies: Negative.   Psychiatric/Behavioral: Positive for substance abuse.

## 2013-10-04 NOTE — Consult Note (Signed)
[facility] Face-to-Face Psychiatry Consult   Reason for Consult:  Requesting detox Referring Physician:  ER MD  Christian Haley is an 38 y.o. male. Total Time spent with patient: 30 minutes  Assessment: AXIS I:  Alcohol Abuse AXIS II:  Deferred AXIS III:   Past Medical History  Diagnosis Date  . Asperger syndrome   . Alcohol abuse    AXIS IV:  binge drinking AXIS V:  61-70 mild symptoms  Plan:  No evidence of imminent risk to self or others at present.    Subjective:   Christian Haley is a 38 y.o. male patient admitted with requesting detox.  HPI:  Christian Haley says he wanted detox but not 3 days worth and he would rather go home.  He drinks in binges and has reached the end of this binge he says.  He does have Asperger's Syndrome which is apparent and he is aware of the syndrome. He has a job and lives by himself.  He denies any suicidal ideation.  No withdrawal symptoms currently. HPI Elements:   Location:  alcohol dependence. Quality:  binge drinking. Severity:  drinking 3 glasses of wine daily. Timing:  no precipitants. Duration:  binges maybe once per year. Context:  as above.  Past Psychiatric History: Past Medical History  Diagnosis Date  . Asperger syndrome   . Alcohol abuse     reports that he has been smoking Cigarettes.  He has a 20 pack-year smoking history. He does not have any smokeless tobacco history on file. He reports that he drinks alcohol. He reports that he does not use illicit drugs. History reviewed. No pertinent family history.         Allergies:  No Known Allergies  ACT Assessment Complete:  Yes:    Educational Status    Risk to Self: Risk to self Is patient at risk for suicide?: No Substance abuse history and/or treatment for substance abuse?: Yes  Risk to Others:    Abuse:    Prior Inpatient Therapy:    Prior Outpatient Therapy:    Additional Information:                    Objective: Blood pressure 111/83, pulse 118, temperature  98.1 F (36.7 C), temperature source Oral, resp. rate 20, SpO2 100.00%.There is no weight on file to calculate BMI. Results for orders placed during the hospital encounter of 10/04/13 (from the past 72 hour(s))  ACETAMINOPHEN LEVEL     Status: None   Collection Time    10/04/13  8:36 AM      Result Value Ref Range   Acetaminophen (Tylenol), Serum <15.0  10 - 30 ug/mL   Comment:            THERAPEUTIC CONCENTRATIONS VARY     SIGNIFICANTLY. A RANGE OF 10-30     ug/mL MAY BE AN EFFECTIVE     CONCENTRATION FOR MANY PATIENTS.     HOWEVER, SOME ARE BEST TREATED     AT CONCENTRATIONS OUTSIDE THIS     RANGE.     ACETAMINOPHEN CONCENTRATIONS     >150 ug/mL AT 4 HOURS AFTER     INGESTION AND >50 ug/mL AT 12     HOURS AFTER INGESTION ARE     OFTEN ASSOCIATED WITH TOXIC     REACTIONS.  CBC     Status: Abnormal   Collection Time    10/04/13  8:36 AM      Result Value Ref Range  WBC 8.9  4.0 - 10.5 K/uL   RBC 5.38  4.22 - 5.81 MIL/uL   Hemoglobin 17.3 (*) 13.0 - 17.0 g/dL   HCT 47.8  39.0 - 52.0 %   MCV 88.8  78.0 - 100.0 fL   MCH 32.2  26.0 - 34.0 pg   MCHC 36.2 (*) 30.0 - 36.0 g/dL   RDW 12.5  11.5 - 15.5 %   Platelets 370  150 - 400 K/uL  COMPREHENSIVE METABOLIC PANEL     Status: Abnormal   Collection Time    10/04/13  8:36 AM      Result Value Ref Range   Sodium 131 (*) 137 - 147 mEq/L   Potassium 4.2  3.7 - 5.3 mEq/L   Chloride 84 (*) 96 - 112 mEq/L   CO2 22  19 - 32 mEq/L   Glucose, Bld 60 (*) 70 - 99 mg/dL   BUN 8  6 - 23 mg/dL   Creatinine, Ser 0.64  0.50 - 1.35 mg/dL   Calcium 9.8  8.4 - 10.5 mg/dL   Total Protein 8.5 (*) 6.0 - 8.3 g/dL   Albumin 4.9  3.5 - 5.2 g/dL   AST 43 (*) 0 - 37 U/L   ALT 27  0 - 53 U/L   Alkaline Phosphatase 78  39 - 117 U/L   Total Bilirubin 0.8  0.3 - 1.2 mg/dL   GFR calc non Af Amer >90  >90 mL/min   GFR calc Af Amer >90  >90 mL/min   Comment: (NOTE)     The eGFR has been calculated using the CKD EPI equation.     This calculation has  not been validated in all clinical situations.     eGFR's persistently <90 mL/min signify possible Chronic Kidney     Disease.   Anion gap 25 (*) 5 - 15  ETHANOL     Status: Abnormal   Collection Time    10/04/13  8:36 AM      Result Value Ref Range   Alcohol, Ethyl (B) 153 (*) 0 - 11 mg/dL   Comment:            LOWEST DETECTABLE LIMIT FOR     SERUM ALCOHOL IS 11 mg/dL     FOR MEDICAL PURPOSES ONLY  SALICYLATE LEVEL     Status: Abnormal   Collection Time    10/04/13  8:36 AM      Result Value Ref Range   Salicylate Lvl <7.4 (*) 2.8 - 20.0 mg/dL  URINE RAPID DRUG SCREEN (HOSP PERFORMED)     Status: None   Collection Time    10/04/13  8:40 AM      Result Value Ref Range   Opiates NONE DETECTED  NONE DETECTED   Cocaine NONE DETECTED  NONE DETECTED   Benzodiazepines NONE DETECTED  NONE DETECTED   Amphetamines NONE DETECTED  NONE DETECTED   Tetrahydrocannabinol NONE DETECTED  NONE DETECTED   Barbiturates NONE DETECTED  NONE DETECTED   Comment:            DRUG SCREEN FOR MEDICAL PURPOSES     ONLY.  IF CONFIRMATION IS NEEDED     FOR ANY PURPOSE, NOTIFY LAB     WITHIN 5 DAYS.                LOWEST DETECTABLE LIMITS     FOR URINE DRUG SCREEN     Drug Class       Cutoff (ng/mL)  Amphetamine      1000     Barbiturate      200     Benzodiazepine   865     Tricyclics       784     Opiates          300     Cocaine          300     THC              50   Labs are reviewed and are pertinent for alcohol at admission.  Current Facility-Administered Medications  Medication Dose Route Frequency Provider Last Rate Last Dose  . LORazepam (ATIVAN) tablet 0-4 mg  0-4 mg Oral 4 times per day Houston Siren III, MD       Followed by  . [START ON 10/06/2013] LORazepam (ATIVAN) tablet 0-4 mg  0-4 mg Oral Q12H Houston Siren III, MD      . thiamine (VITAMIN B-1) tablet 100 mg  100 mg Oral Daily Houston Siren III, MD   100 mg at 10/04/13 1007   Or  . thiamine (B-1) injection 100  mg  100 mg Intravenous Daily Houston Siren III, MD       No current outpatient prescriptions on file.    Psychiatric Specialty Exam:     Blood pressure 111/83, pulse 118, temperature 98.1 F (36.7 C), temperature source Oral, resp. rate 20, SpO2 100.00%.There is no weight on file to calculate BMI.  General Appearance: Casual  Eye Contact::  Good  Speech:  Clear and Coherent  Volume:  Normal  Mood:  Euthymic  Affect:  Blunt  Thought Process:  Coherent and Logical  Orientation:  Full (Time, Place, and Person)  Thought Content:  Negative  Suicidal Thoughts:  No  Homicidal Thoughts:  No  Memory:  Immediate;   Good Recent;   Good Remote;   Good  Judgement:  Good  Insight:  Fair  Psychomotor Activity:  Normal  Concentration:  Good  Recall:  Good  Fund of Knowledge:Good  Language: Good  Akathisia:  Negative  Handed:  Right  AIMS (if indicated):     Assets:  Communication Skills Desire for Improvement Financial Resources/Insurance Housing Physical Health Transportation Vocational/Educational  Sleep:      Musculoskeletal: Strength & Muscle Tone: within normal limits Gait & Station: normal Patient leans: N/A  Treatment Plan Summary: discharge home per his request, does not meet criteria for inpatient  TAYLOR,GERALD D 10/04/2013 10:40 AM

## 2013-10-04 NOTE — ED Notes (Signed)
Per pt, has been drinking heavy for last few days.  Has chronic history of 1-4 days of drinking per week for 1 1/2 years.  Pt states has been vomiting since Tuesday and not keeping food down or eating much in last two days.  Pt last drink last night.

## 2013-10-04 NOTE — [facility] Suicide Risk Assessment (Signed)
Suicide Risk Assessment  Discharge Assessment     Demographic Factors:  Male, Caucasian and Living alone  Total Time spent with patient: 30 minutes  Psychiatric Specialty Exam:     Blood pressure 111/83, pulse 118, temperature 98.1 F (36.7 C), temperature source Oral, resp. rate 20, SpO2 100.00%.There is no weight on file to calculate BMI.  General Appearance: Casual  Eye Contact::  Good  Speech:  Clear and Coherent  Volume:  Normal  Mood:  Euthymic  Affect:  Appropriate  Thought Process:  Coherent and Logical  Orientation:  Full (Time, Place, and Person)  Thought Content:  Negative  Suicidal Thoughts:  No  Homicidal Thoughts:  No  Memory:  Immediate;   Good Recent;   Good Remote;   Good  Judgement:  Good  Insight:  Fair  Psychomotor Activity:  Normal  Concentration:  Good  Recall:  Good  Fund of Knowledge:Good  Language: Good  Akathisia:  Negative  Handed:  Right  AIMS (if indicated):     Assets:  Communication Skills Desire for Improvement Financial Resources/Insurance Physical Health Transportation Vocational/Educational  Sleep:       Musculoskeletal: Strength & Muscle Tone: within normal limits Gait & Station: normal Patient leans: N/A   Mental Status Per Nursing Assessment::   On Admission:     Current Mental Status by Physician: NA  Loss Factors: NA  Historical Factors: NA  Risk Reduction Factors:   Employed  Continued Clinical Symptoms:  Alcohol/Substance Abuse/Dependencies  Cognitive Features That Contribute To Risk:  Closed-mindedness    Suicide Risk:  Minimal: No identifiable suicidal ideation.  Patients presenting with no risk factors but with morbid ruminations; may be classified as minimal risk based on the severity of the depressive symptoms  Discharge Diagnoses:   AXIS I:  Alcohol Abuse AXIS II:  Deferred AXIS III:   Past Medical History  Diagnosis Date  . Asperger syndrome   . Alcohol abuse    AXIS IV:   addiction AXIS V:  61-70 mild symptoms  Plan Of Care/Follow-up recommendations:  Activity:  resume usual activity Diet:  resume usual diet  Is patient on multiple antipsychotic therapies at discharge:  No   Has Patient had three or more failed trials of antipsychotic monotherapy by history:  No  Recommended Plan for Multiple Antipsychotic Therapies: NA    Aiesha Leland D 10/04/2013, 11:10 AM

## 2013-10-04 NOTE — ED Notes (Signed)
Tennis shoes- lighter,cigarettes, keys and watch in shoe Green shirt Khaki shorts White socks  Placed in belongings bag

## 2013-10-04 NOTE — ED Notes (Signed)
Pt is anxious, states "I didn't realize that detox can take 2 to 3 days", pt states that he does not want to go through with detox and would like to be discharged back home.

## 2013-10-04 NOTE — ED Notes (Signed)
Pt out in the hall again heading towards the exit. Pt told if he wants treatment he must stay in his room.

## 2014-01-07 ENCOUNTER — Ambulatory Visit (INDEPENDENT_AMBULATORY_CARE_PROVIDER_SITE_OTHER): Payer: BC Managed Care – PPO | Admitting: Family Medicine

## 2014-01-07 VITALS — BP 104/70 | HR 80 | Temp 97.8°F | Resp 16 | Ht 68.25 in | Wt 158.8 lb

## 2014-01-07 DIAGNOSIS — Z0189 Encounter for other specified special examinations: Secondary | ICD-10-CM

## 2014-01-07 DIAGNOSIS — Z0283 Encounter for blood-alcohol and blood-drug test: Secondary | ICD-10-CM

## 2014-01-07 NOTE — Progress Notes (Signed)
Chief Complaint:  Chief Complaint  Patient presents with  . Drug Screen    Self-pay    HPI: Christian Haley is a 38 y.o. male who is here for  Personal DS His drug of choice is alcohol, last drink was October 14. He denies any withdrawal sxs.  He has been addicted to alcohol since age 40 He is seeing a Social worker and addiction specialist, he is under her care and so they agreed to have  DS.  He would like [place] to fax over his results to his therapist:   Past Medical History  Diagnosis Date  . Asperger syndrome   . Alcohol abuse    History reviewed. No pertinent past surgical history. History   Social History  . Marital Status: Single    Spouse Name: N/A    Number of Children: N/A  . Years of Education: N/A   Social History Main Topics  . Smoking status: Current Every Day Smoker -- 1.00 packs/day for 20 years    Types: Cigarettes  . Smokeless tobacco: None  . Alcohol Use: Yes     Comment: 1-4 days a week  . Drug Use: No  . Sexual Activity: None   Other Topics Concern  . None   Social History Narrative  . None   History reviewed. No pertinent family history. No Known Allergies Prior to Admission medications   Not on File     ROS: The patient denies fevers, chills, night sweats, unintentional weight loss, chest pain, palpitations, wheezing, dyspnea on exertion, nausea, vomiting, abdominal pain, dysuria, hematuria, melena, numbness, weakness, or tingling.   All other systems have been reviewed and were otherwise negative with the exception of those mentioned in the HPI and as above.    PHYSICAL EXAM: Filed Vitals:   01/07/14 1336  BP: 104/70  Pulse: 80  Temp: 97.8 F (36.6 C)  Resp: 16   Filed Vitals:   01/07/14 1336  Height: 5' 8.25" (1.734 m)  Weight: 158 lb 12.8 oz (72.031 kg)   Body mass index is 23.96 kg/(m^2).  General: Alert, no acute distress HEENT:  Normocephalic, atraumatic, oropharynx patent. EOMI, PERRLA Cardiovascular:  Regular  rate and rhythm, no rubs murmurs or gallops.  Radial pulse intact. No pedal edema.  Respiratory: Clear to auscultation bilaterally.  No wheezes, rales, or rhonchi.  No cyanosis, no use of accessory musculature GI: No organomegaly, abdomen is soft and non-tender, positive bowel sounds.  No masses. Skin: No rashes. Neurologic: Facial musculature symmetric. No tremors Psychiatric: Patient is appropriate throughout our interaction. Lymphatic: No cervical lymphadenopathy Musculoskeletal: Gait intact.   LABS: Results for orders placed during the hospital encounter of 10/04/13  ACETAMINOPHEN LEVEL      Result Value Ref Range   Acetaminophen (Tylenol), Serum <15.0  10 - 30 ug/mL  CBC      Result Value Ref Range   WBC 8.9  4.0 - 10.5 K/uL   RBC 5.38  4.22 - 5.81 MIL/uL   Hemoglobin 17.3 (*) 13.0 - 17.0 g/dL   HCT 47.8  39.0 - 52.0 %   MCV 88.8  78.0 - 100.0 fL   MCH 32.2  26.0 - 34.0 pg   MCHC 36.2 (*) 30.0 - 36.0 g/dL   RDW 12.5  11.5 - 15.5 %   Platelets 370  150 - 400 K/uL  COMPREHENSIVE METABOLIC PANEL      Result Value Ref Range   Sodium 131 (*) 137 - 147 mEq/L  Potassium 4.2  3.7 - 5.3 mEq/L   Chloride 84 (*) 96 - 112 mEq/L   CO2 22  19 - 32 mEq/L   Glucose, Bld 60 (*) 70 - 99 mg/dL   BUN 8  6 - 23 mg/dL   Creatinine, Ser 0.64  0.50 - 1.35 mg/dL   Calcium 9.8  8.4 - 10.5 mg/dL   Total Protein 8.5 (*) 6.0 - 8.3 g/dL   Albumin 4.9  3.5 - 5.2 g/dL   AST 43 (*) 0 - 37 U/L   ALT 27  0 - 53 U/L   Alkaline Phosphatase 78  39 - 117 U/L   Total Bilirubin 0.8  0.3 - 1.2 mg/dL   GFR calc non Af Amer >90  >90 mL/min   GFR calc Af Amer >90  >90 mL/min   Anion gap 25 (*) 5 - 15  ETHANOL      Result Value Ref Range   Alcohol, Ethyl (B) 153 (*) 0 - 11 mg/dL  SALICYLATE LEVEL      Result Value Ref Range   Salicylate Lvl <5.0 (*) 2.8 - 20.0 mg/dL  URINE RAPID DRUG SCREEN (HOSP PERFORMED)      Result Value Ref Range   Opiates NONE DETECTED  NONE DETECTED   Cocaine NONE DETECTED  NONE  DETECTED   Benzodiazepines NONE DETECTED  NONE DETECTED   Amphetamines NONE DETECTED  NONE DETECTED   Tetrahydrocannabinol NONE DETECTED  NONE DETECTED   Barbiturates NONE DETECTED  NONE DETECTED     EKG/XRAY:   Primary read interpreted by Dr. Marin Comment at [facility].   ASSESSMENT/PLAN: Encounter Diagnosis  Name Primary?  . Encounter for drug screening Yes   Will fax to counselor when we get results  Doroteo Bradford MA, [place], [place], [place] [address] [phone] ( phone) [phone] ( fax) F/u prn  Gross sideeffects, risk and benefits, and alternatives of medications d/w patient. Patient is aware that all medications have potential sideeffects and we are unable to predict every sideeffect or drug-drug interaction that may occur.  Yvonnie Schinke, H. [place], DO 01/07/2014 2:38 PM    ;

## 2015-02-12 ENCOUNTER — Emergency Department (HOSPITAL_COMMUNITY)
Admission: EM | Admit: 2015-02-12 | Discharge: 2015-02-12 | Disposition: A | Discharge status: Home / Self Care | Payer: BLUE CROSS/BLUE SHIELD | Attending: Emergency Medicine | Admitting: Emergency Medicine

## 2015-02-12 ENCOUNTER — Emergency Department (HOSPITAL_COMMUNITY): Payer: BLUE CROSS/BLUE SHIELD

## 2015-02-12 ENCOUNTER — Encounter (HOSPITAL_COMMUNITY): Payer: Self-pay | Admitting: Emergency Medicine

## 2015-02-12 DIAGNOSIS — W1839XA Other fall on same level, initial encounter: Secondary | ICD-10-CM | POA: Insufficient documentation

## 2015-02-12 DIAGNOSIS — Y9289 Other specified places as the place of occurrence of the external cause: Secondary | ICD-10-CM | POA: Insufficient documentation

## 2015-02-12 DIAGNOSIS — Z8659 Personal history of other mental and behavioral disorders: Secondary | ICD-10-CM | POA: Insufficient documentation

## 2015-02-12 DIAGNOSIS — F17210 Nicotine dependence, cigarettes, uncomplicated: Secondary | ICD-10-CM | POA: Diagnosis not present

## 2015-02-12 DIAGNOSIS — Y93E1 Activity, personal bathing and showering: Secondary | ICD-10-CM | POA: Insufficient documentation

## 2015-02-12 DIAGNOSIS — S43014A Anterior dislocation of right humerus, initial encounter: Secondary | ICD-10-CM | POA: Diagnosis not present

## 2015-02-12 DIAGNOSIS — S4991XA Unspecified injury of right shoulder and upper arm, initial encounter: Secondary | ICD-10-CM | POA: Diagnosis present

## 2015-02-12 DIAGNOSIS — Y998 Other external cause status: Secondary | ICD-10-CM | POA: Diagnosis not present

## 2015-02-12 MED ORDER — LIDOCAINE HCL 2 % IJ SOLN
INTRAMUSCULAR | Status: AC
  Filled 2015-02-12: qty 20

## 2015-02-12 MED ORDER — FENTANYL CITRATE (PF) 100 MCG/2ML IJ SOLN
INTRAMUSCULAR | Status: AC
  Administered 2015-02-12: 100 ug
  Filled 2015-02-12: qty 2

## 2015-02-12 MED ORDER — LIDOCAINE HCL (PF) 2 % IJ SOLN
10.0000 mL | Freq: Once | INTRAMUSCULAR | Status: AC
  Administered 2015-02-12: 10 mL
  Filled 2015-02-12: qty 10

## 2015-02-12 MED ORDER — FENTANYL CITRATE (PF) 100 MCG/2ML IJ SOLN
100.0000 ug | Freq: Once | INTRAMUSCULAR | Status: AC
  Administered 2015-02-12: 100 ug via INTRAVENOUS
  Filled 2015-02-12: qty 2

## 2015-02-12 NOTE — ED Notes (Signed)
Bed: WA16 Expected date:  Expected time:  Means of arrival:  Comments: EMS fall/shoulder pain 

## 2015-02-12 NOTE — ED Notes (Signed)
Pt arrived to the ED with a a complaint of shoulder pain from a dislocated shoulder.  Pt slipped in the shower and attempted to catch himself with his right hand and dislocated his right shoulder.  Pt has obvious deformity to right shoulder.  Pt given 150 mcg of fentnyl in route by EMS.

## 2015-02-12 NOTE — ED Provider Notes (Signed)
CSN: [id number]     Arrival date & time 02/12/15  0645 History   First MD Initiated Contact with Patient 02/12/15 [phone]     Chief Complaint  Patient presents with  . Shoulder Pain     (Consider location/radiation/quality/duration/timing/severity/associated sxs/prior Treatment)  HPI 39 year old male who presents with right shoulder pain. History of aspirin occurs and alcohol abuse. States that he was in the shower today and slipped on a shower mat. Fell onto his right shoulder. Did not hit his head or have loss of consciousness. Denies neck pain, back pain, chest pain, abdominal pain, or any other injuries.   Past Medical History  Diagnosis Date  . Asperger syndrome   . Alcohol abuse    History reviewed. No pertinent past surgical history. History reviewed. No pertinent family history. Social History  Substance Use Topics  . Smoking status: Current Every Day Smoker -- 1.00 packs/day for 20 years    Types: Cigarettes  . Smokeless tobacco: None  . Alcohol Use: Yes     Comment: 1-4 days a week    Review of Systems 10/14 systems reviewed and are negative other than those stated in the HPI    Allergies  Review of patient's allergies indicates no known allergies.  Home Medications   Prior to Admission medications   Not on File   BP 106/95 mmHg  Pulse 124  Temp(Src) 98.3 F (36.8 C) (Oral)  Resp 18  Ht 5\' 10"  (1.778 m)  Wt 187 lb (84.823 kg)  BMI 26.83 kg/m2  SpO2 100% Physical Exam Physical Exam  Nursing note and vitals reviewed. Constitutional: Well developed, well nourished, non-toxic, and in no acute distress Head: Normocephalic and atraumatic.  Mouth/Throat: Oropharynx is clear and moist.  Neck: Normal range of motion. Neck supple. No cervical spine tenderness. Cardiovascular: Normal rate and regular rhythm.   Pulmonary/Chest: Effort normal and breath sounds normal.  Abdominal: Soft. There is no tenderness. There is no rebound and no guarding.   Musculoskeletal: Deformity of the right shoulder with widened shoulder joint space on exam.  Neurological: Alert, no facial droop, fluent speech, moves all extremities symmetrically, intact innervation involving the axillary, median, ulnar, and radial nerves. Skin: Skin is warm and dry.  Psychiatric: Cooperative  ED Course  Reduction of dislocation Date/Time: 02/12/2015 9:01 AM Performed by: Brantley Stage DUO Authorized by: Brantley Stage DUO Consent: Verbal consent obtained. Risks and benefits: risks, benefits and alternatives were discussed Consent given by: patient Patient identity confirmed: verbally with patient Time out: Immediately prior to procedure a "time out" was called to verify the correct patient, procedure, equipment, support staff and site/side marked as required. Local anesthesia used: yes Anesthesia method: Intraarticular injection. Local anesthetic: lidocaine 2% with epinephrine Anesthetic total: 8 ml Patient sedated: no Patient tolerance: Patient tolerated the procedure well with no immediate complications   (including critical care time) Labs Review Labs Reviewed - No data to display  Imaging Review Dg Shoulder Right  02/12/2015  CLINICAL DATA:  Status post reduction of right shoulder dislocation. EXAM: RIGHT SHOULDER - 2+ VIEW COMPARISON:  Films earlier today. FINDINGS: After reduction, alignment at the glenohumeral joint appears anatomic. There is no evidence of fracture. The [facility] joint shows normal alignment and appearance. Soft tissues are unremarkable. No bony lesions are seen. IMPRESSION: Normal alignment of the glenohumeral joint after reduction of dislocation. No acute fracture is identified. Electronically Signed   By: Aletta Edouard M.D.   On: 02/12/2015 08:39   Dg Shoulder Right  02/12/2015  CLINICAL DATA:  Pain following fall EXAM: RIGHT SHOULDER - 2+ VIEW COMPARISON:  None. FINDINGS: Frontal and Y scapular views were obtained. There is subcoracoid anterior  dislocation. No fracture evident. Acromioclavicular and coracoclavicular joints appear unremarkable. IMPRESSION: Subcoracoid anterior dislocation.  No acute fracture evident. Electronically Signed   By: Lowella Grip III M.D.   On: 02/12/2015 07:18   I have personally reviewed and evaluated these images and lab results as part of my medical decision-making.   MDM   Final diagnoses:  Anterior shoulder dislocation, right, initial encounter    39 year old male who presents with anterior right shoulder dislocation. Extremity is neurovascularly intact. X-ray confirms anterior right shoulder dislocation. Intra-articular lidocaine was instilled and he also received an additional 100 g of fentanyl. I did attempt Cunningham method in [place] without reduction. In process of hanging 10 pound weights on patient's arm for reduction, full reduction of his shoulder dislocation was achieved. Reduction confirmed by x-ray, and without associating fracture. Repeat exam reveals neurovascularly intact right upper extremity. Discussed care instructions for home and restrictions for work in home. Strict return instructions also reviewed. He expressed understanding of all discharge instructions and felt comfortable with the plan of care.    Forde Dandy, MD 02/12/15 [phone]

## 2015-02-12 NOTE — ED Notes (Signed)
Patient transported to X-ray 

## 2015-02-12 NOTE — Discharge Instructions (Signed)
Please keep shoulder in a sling and do not raise your shoulder/arm above your head. Continue to do range of motion exercises of your shoulder while in sling. Please follow-up with orthopedic surgeon listed above for close follow-up. Return without fail for worsening symptoms, including numbness or weakness of your arm, recurrent shoulder injury, or any other symptoms concerning to you.   Shoulder Dislocation Your shoulder joint is made up of 3 bones:  The upper arm bone (humerus).  The shoulder blade (scapula).  The collarbone (clavicle). A shoulder dislocation happens when your upper arm bone moves out of its normal place in your shoulder joint. HOME CARE If You Have a Splint or Sling:  Wear it as told by your doctor.  Take it off only as told by your doctor.  Loosen it if:  Your fingers become numb and tingly.  Your fingers turn cold and blue.  Keep it clean and dry. Bathing  Do not take baths, swim, or use a hot tub until your doctor says you can. Ask your doctor if you can take showers. You may only be allowed to take sponge baths.  If your doctor says taking baths or showers is okay, cover your splint or sling with a plastic bag. Do not let the splint or sling get wet. Managing Pain, Stiffness, and Swelling  If told, put ice on the injured area.  Put ice in a plastic bag.  Place a towel between your skin and the bag.  Leave the ice on for 20 minutes, 2-3 times per day.  Move your fingers often to avoid stiffness and to lessen swelling.  Raise (elevate) the injured area above the level of your heart while you are sitting or lying down. Driving  Do not drive while you are wearing a splint or sling on a hand that you use for driving.  Do not drive or operate heavy machinery while taking pain medicine. Activity  Return to your normal activities as told by your doctor. Ask your doctor what activities are safe for you.  Do range-of-motion exercises only as told by  your doctor.  Exercise your hand by squeezing a soft ball. This keeps your hand and wrist from getting stiff and swollen. General Instructions  Take over-the-counter and prescription medicines only as told by your doctor.  Do not use any tobacco products, including cigarettes, chewing tobacco, or e-cigarettes. Tobacco can slow down healing. If you need help quitting, ask your doctor.  Keep all follow-up visits as told by your doctor. This is important. GET HELP IF:  Your splint or sling gets damaged. GET HELP RIGHT AWAY IF:  Your pain gets worse instead of better.  You lose feeling in your arm or hand.  Your arm or hand turns white and cold.   This information is not intended to replace advice given to you by your health care provider. Make sure you discuss any questions you have with your health care provider.   Document Released: 05/24/2011 Document Revised: 11/20/2014 Document Reviewed: 06/24/2014 Elsevier Interactive Patient Education [employer].

## 2016-06-27 IMAGING — CR DG SHOULDER 2+V*R*
2 series · 2 of 2 positions shown · non-contrast
Comparison: None.

CLINICAL DATA: Pain following fall

EXAM:
RIGHT SHOULDER - 2+ VIEW

[x shoulder ap right (1 of 2)]
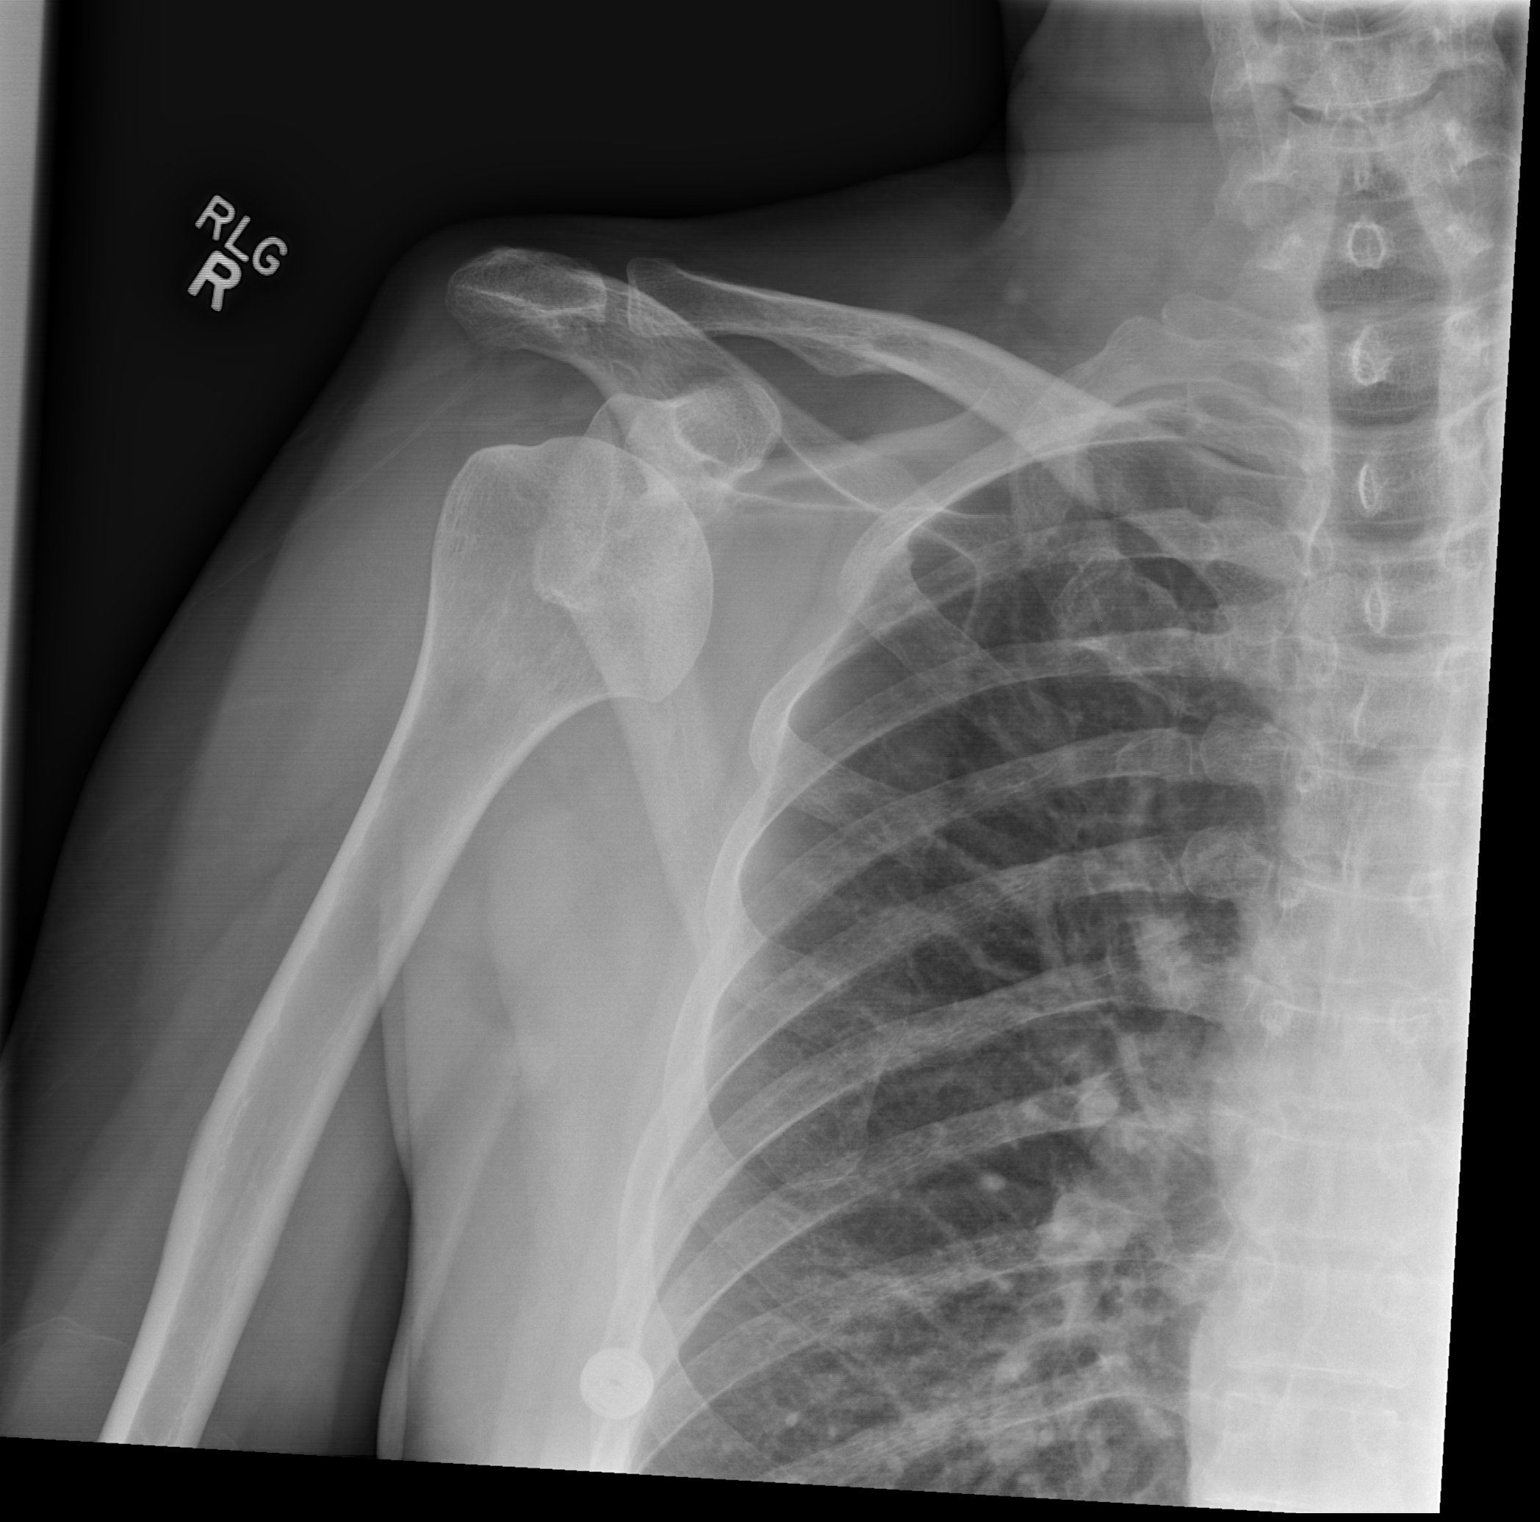

[x shoulder ap right (2 of 2)]
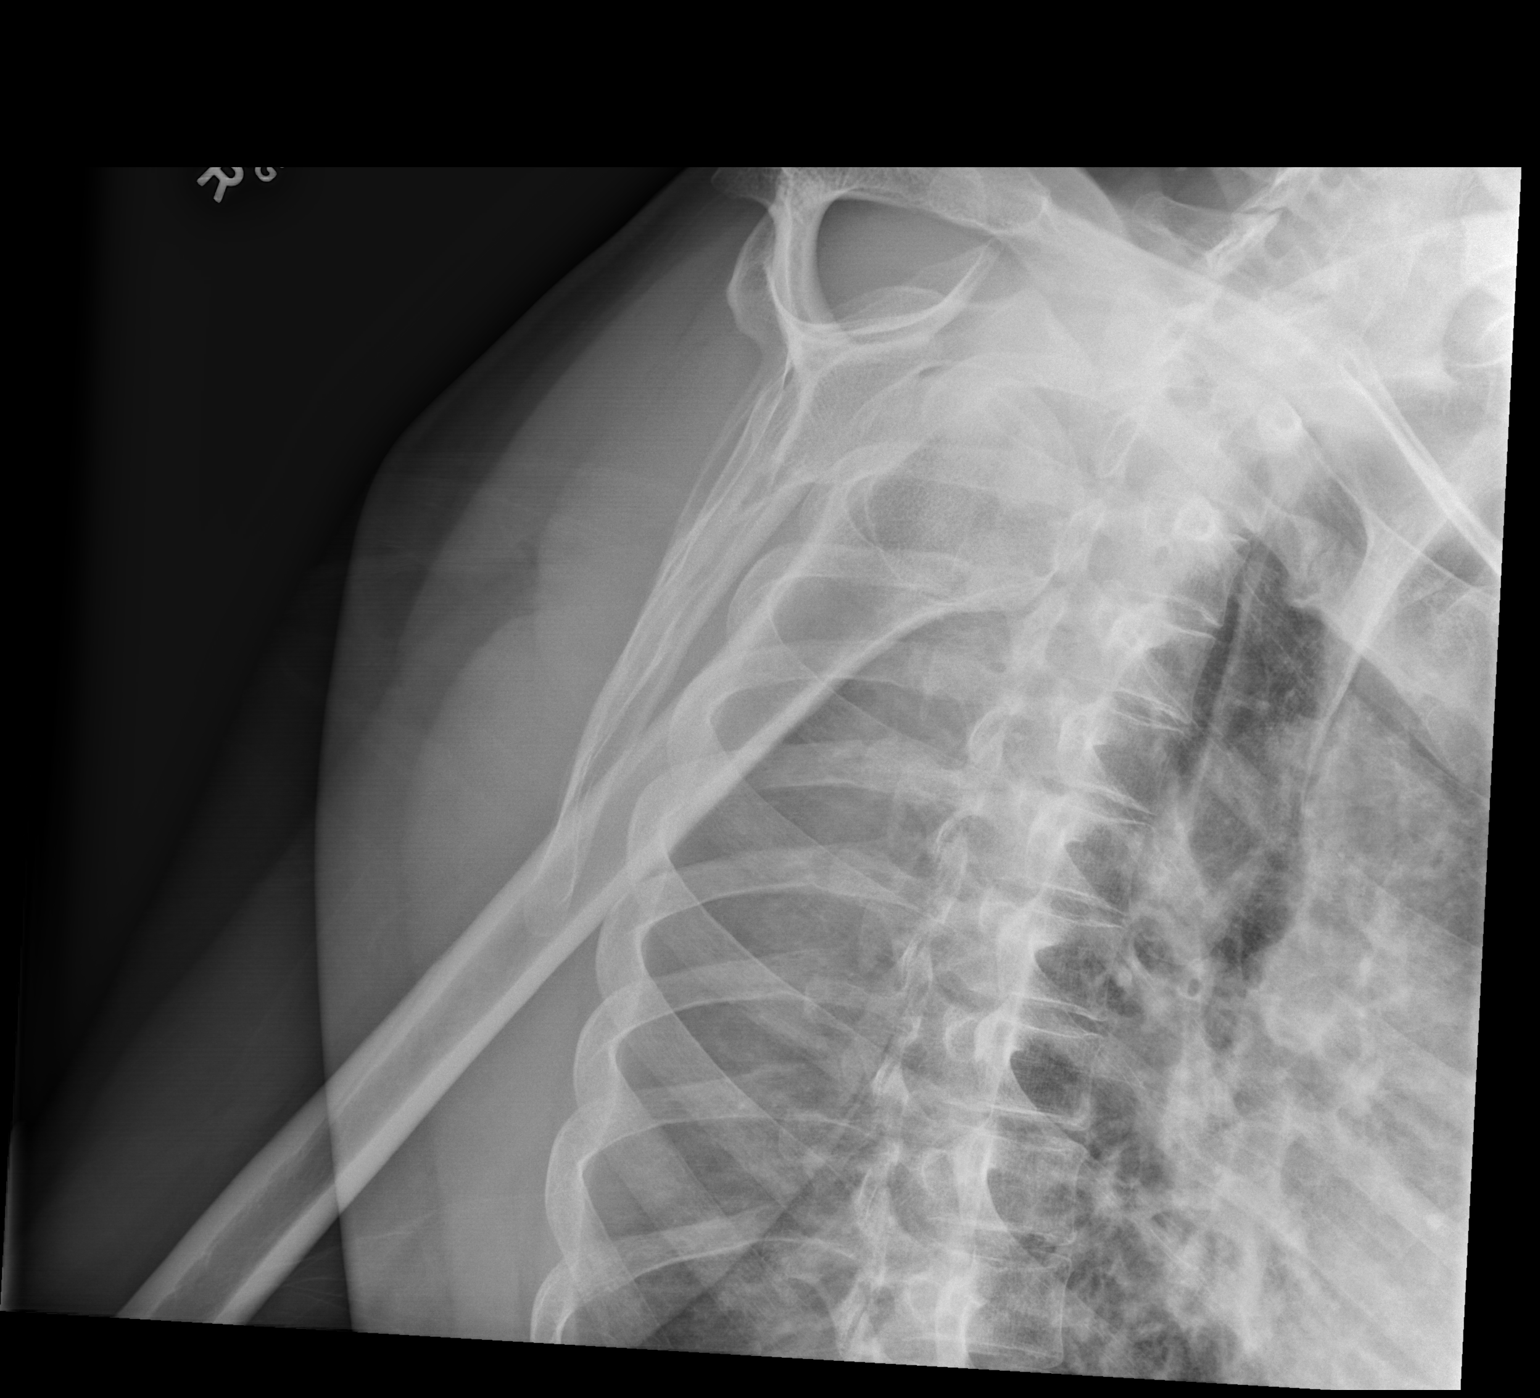

[2 of 2 positions shown; findings below may reference images not displayed]

FINDINGS: Frontal and Y scapular views were obtained. There is subcoracoid
anterior dislocation. No fracture evident. Acromioclavicular and
coracoclavicular joints appear unremarkable.
IMPRESSION: Subcoracoid anterior dislocation.  No acute fracture evident.

## 2016-12-03 DIAGNOSIS — F419 Anxiety disorder, unspecified: Secondary | ICD-10-CM | POA: Insufficient documentation

## 2018-01-05 DIAGNOSIS — F064 Anxiety disorder due to known physiological condition: Secondary | ICD-10-CM | POA: Diagnosis not present

## 2018-01-05 DIAGNOSIS — F840 Autistic disorder: Secondary | ICD-10-CM | POA: Diagnosis not present

## 2018-01-23 DIAGNOSIS — Z23 Encounter for immunization: Secondary | ICD-10-CM | POA: Diagnosis not present

## 2018-07-17 DIAGNOSIS — F0631 Mood disorder due to known physiological condition with depressive features: Secondary | ICD-10-CM | POA: Diagnosis not present

## 2018-07-17 DIAGNOSIS — F840 Autistic disorder: Secondary | ICD-10-CM | POA: Diagnosis not present

## 2018-07-17 DIAGNOSIS — F064 Anxiety disorder due to known physiological condition: Secondary | ICD-10-CM | POA: Diagnosis not present

## 2019-01-11 DIAGNOSIS — F064 Anxiety disorder due to known physiological condition: Secondary | ICD-10-CM | POA: Diagnosis not present

## 2019-01-11 DIAGNOSIS — F0631 Mood disorder due to known physiological condition with depressive features: Secondary | ICD-10-CM | POA: Diagnosis not present

## 2019-01-11 DIAGNOSIS — F840 Autistic disorder: Secondary | ICD-10-CM | POA: Diagnosis not present

## 2019-07-09 DIAGNOSIS — F064 Anxiety disorder due to known physiological condition: Secondary | ICD-10-CM | POA: Diagnosis not present

## 2019-07-09 DIAGNOSIS — F840 Autistic disorder: Secondary | ICD-10-CM | POA: Diagnosis not present

## 2019-07-09 DIAGNOSIS — F0631 Mood disorder due to known physiological condition with depressive features: Secondary | ICD-10-CM | POA: Diagnosis not present

## 2019-11-09 DIAGNOSIS — D23111 Other benign neoplasm of skin of right upper eyelid, including canthus: Secondary | ICD-10-CM | POA: Diagnosis not present

## 2019-11-14 ENCOUNTER — Other Ambulatory Visit: Payer: Self-pay

## 2019-11-15 ENCOUNTER — Encounter: Payer: Self-pay | Admitting: Internal Medicine

## 2019-11-15 ENCOUNTER — Ambulatory Visit (INDEPENDENT_AMBULATORY_CARE_PROVIDER_SITE_OTHER): Payer: BC Managed Care – PPO | Admitting: Internal Medicine

## 2019-11-15 VITALS — BP 110/72 | HR 89 | Temp 98.1°F | Ht 67.5 in | Wt 200.9 lb

## 2019-11-15 DIAGNOSIS — F17200 Nicotine dependence, unspecified, uncomplicated: Secondary | ICD-10-CM | POA: Insufficient documentation

## 2019-11-15 DIAGNOSIS — Z23 Encounter for immunization: Secondary | ICD-10-CM | POA: Diagnosis not present

## 2019-11-15 DIAGNOSIS — F840 Autistic disorder: Secondary | ICD-10-CM | POA: Diagnosis not present

## 2019-11-15 DIAGNOSIS — F339 Major depressive disorder, recurrent, unspecified: Secondary | ICD-10-CM | POA: Diagnosis not present

## 2019-11-15 DIAGNOSIS — F17210 Nicotine dependence, cigarettes, uncomplicated: Secondary | ICD-10-CM | POA: Diagnosis not present

## 2019-11-15 NOTE — Addendum Note (Signed)
Addended by: Westley Hummer B on: 11/15/2019 05:09 PM   Modules accepted: Orders

## 2019-11-15 NOTE — Progress Notes (Signed)
New Patient Office Visit     This visit occurred during the SARS-CoV-2 public health emergency.  Safety protocols were in place, including screening questions prior to the visit, additional usage of staff PPE, and extensive cleaning of exam room while observing appropriate contact time as indicated for disinfecting solutions.    CC/Reason for Visit: Establish care, discuss chronic medical conditions Previous PCP:, Cindee Lame, MD ([facility]) Last Visit: 2016  HPI: Christian Haley is a 44 y.o. male who is coming in today for the above mentioned reasons. 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  He is still smoking about 15 cigarettes a day has been trying to quit and has been using nicotine lozenges as well.  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  His family history is significant for mother who was in a fatal MVA in 1993, father who is currently battling pancreatic cancer, paternal grandmother with CVA and a paternal grandfather with lung cancer, she was a very heavy smoker.  He has no acute complaints today.   Past Medical/Surgical History: Past Medical History:  Diagnosis Date  . Alcohol abuse   . Asperger syndrome   . Depression   . Nicotine dependence     History reviewed. No pertinent surgical history.  Social History:  reports that he has been smoking cigarettes. He has a 20.00 pack-year smoking history. He has never used smokeless tobacco. He reports previous alcohol use. He reports that he does not use drugs.  Allergies: No Known Allergies  Family History:  Family History  Problem Relation Age of Onset  .  Pancreatic cancer Father   . CVA Paternal Grandmother   . Lung cancer Paternal Grandfather      Current Outpatient Medications:  .  PARoxetine (PAXIL) 20 MG tablet, Take 20 mg by mouth 2 (two) times daily., Disp: , Rfl:   Review of Systems:  Constitutional: Denies fever, chills, diaphoresis, appetite change and fatigue.  HEENT: Denies photophobia, eye pain, redness, hearing loss, ear pain, congestion, sore throat, rhinorrhea, sneezing, mouth sores, trouble swallowing, neck pain, neck stiffness and tinnitus.   Respiratory: Denies SOB, DOE, cough, chest tightness,  and wheezing.   Cardiovascular: Denies chest pain, palpitations and leg swelling.  Gastrointestinal: Denies nausea, vomiting, abdominal pain, diarrhea, constipation, blood in stool and abdominal distention.  Genitourinary: Denies dysuria, urgency, frequency, hematuria, flank pain and difficulty urinating.  Endocrine: Denies: hot or cold intolerance, sweats, changes in hair or nails, polyuria, polydipsia. Musculoskeletal: Denies myalgias, back pain, joint swelling, arthralgias and gait problem.  Skin: Denies pallor, rash and wound.  Neurological: Denies dizziness, seizures, syncope, weakness, light-headedness, numbness and headaches.  Hematological: Denies adenopathy. Easy bruising, personal or family bleeding history  Psychiatric/Behavioral: Denies suicidal ideation, mood changes, confusion, nervousness, sleep disturbance and agitation    Physical Exam: Vitals:   11/15/19 1301  BP: 110/72  Pulse: 89  Temp: 98.1 F (36.7 C)  TempSrc: Oral  SpO2: 95%  Weight: 200 lb 14.4 oz (91.1 kg)  Height: 5' 7.5" (1.715 m)   Body mass index is 31 kg/m.  Constitutional: NAD, calm, comfortable Eyes: PERRL, lids and conjunctivae normal ENMT: Mucous membranes are moist.  Respiratory: clear to  auscultation bilaterally, no wheezing, no crackles. Normal respiratory effort. No accessory muscle use.  Cardiovascular: Regular rate and  rhythm, no murmurs / rubs / gallops. No extremity edema. Neurologic: grossly intact and non-focal Psychiatric: Normal judgment and insight. Alert and oriented x 3. Normal mood.    Impression and Plan:  Autism spectrum disorder -Noted  Depression, recurrent ([place]) -mood is stable on paxil. -Continue follow up as scheduled with psychiatry.   Cigarette nicotine dependence without complication -I have discussed tobacco cessation with the patient.  I have counseled the patient regarding the negative impacts of continued tobacco use including but not limited to lung cancer, COPD, and cardiovascular disease.  I have discussed alternatives to tobacco and modalities that may help facilitate tobacco cessation including but not limited to biofeedback, hypnosis, and medications.  Total time spent with tobacco counseling was 4 minutes. -he prefers to continue treatment with nicotine lozenges for now. -Will continue to address at subsequent visits.    Patient Instructions  -Nice seeing you today!!  -Flu and tetanus vaccines today.  -Schedule follow up in 3 months for your physical. Please come in fasting that day.       Lelon Frohlich, MD [facility]

## 2019-11-15 NOTE — Patient Instructions (Signed)
-  Nice seeing you today!!  -Flu and tetanus vaccines today.  -Schedule follow up in 3 months for your physical. Please come in fasting that day.

## 2020-01-07 DIAGNOSIS — F0631 Mood disorder due to known physiological condition with depressive features: Secondary | ICD-10-CM | POA: Diagnosis not present

## 2020-01-07 DIAGNOSIS — F064 Anxiety disorder due to known physiological condition: Secondary | ICD-10-CM | POA: Diagnosis not present

## 2020-01-07 DIAGNOSIS — F840 Autistic disorder: Secondary | ICD-10-CM | POA: Diagnosis not present

## 2020-03-05 ENCOUNTER — Encounter: Payer: BC Managed Care – PPO | Admitting: Internal Medicine

## 2020-03-06 ENCOUNTER — Other Ambulatory Visit: Payer: Self-pay

## 2020-03-06 ENCOUNTER — Ambulatory Visit (INDEPENDENT_AMBULATORY_CARE_PROVIDER_SITE_OTHER): Payer: BC Managed Care – PPO | Admitting: Family Medicine

## 2020-03-06 ENCOUNTER — Encounter: Payer: Self-pay | Admitting: Family Medicine

## 2020-03-06 VITALS — BP 112/72 | HR 93 | Temp 98.4°F | Ht 67.5 in | Wt 193.0 lb

## 2020-03-06 DIAGNOSIS — K920 Hematemesis: Secondary | ICD-10-CM

## 2020-03-06 MED ORDER — PANTOPRAZOLE SODIUM 40 MG PO TBEC: 40.0000 mg | DELAYED_RELEASE_TABLET | Freq: Two times a day (BID) | ORAL | 0 refills | Status: DC

## 2020-03-06 NOTE — Progress Notes (Signed)
   Subjective:    Patient ID: Christian Haley, male    DOB: September 24, 1975, 44 y.o.   MRN: [id number]  HPI Here for one week of intermittent burning epigastric pains and frequent nausea with occasional vomiting. The vomitus is often dark and he describes it as looking like "coffee grounds". His stools are formed but are black in color. No fever. He has frequent heartburn for which he takes [place]. This past week he has been taking TUMS and Pepto-Bismol. He has a hx of alcohol abuse, but he denies using any alcohol for several months. He does smoke.    Review of Systems  Constitutional: Negative.   Respiratory: Negative.   Cardiovascular: Negative.   Gastrointestinal: Positive for abdominal pain, nausea and vomiting. Negative for abdominal distention, anal bleeding, constipation and diarrhea.       Objective:   Physical Exam Constitutional:      Appearance: Normal appearance. He is not ill-appearing.  Eyes:     General: No scleral icterus. Cardiovascular:     Rate and Rhythm: Normal rate and regular rhythm.     Pulses: Normal pulses.     Heart sounds: Normal heart sounds.  Pulmonary:     Effort: Pulmonary effort is normal.     Breath sounds: Normal breath sounds.  Abdominal:     General: Abdomen is flat. Bowel sounds are normal. There is no distension.     Palpations: Abdomen is soft. There is no mass.     Tenderness: There is no guarding or rebound.     Hernia: No hernia is present.     Comments: Slight generalized tenderness   Neurological:     Mental Status: He is alert.           Assessment & Plan:  He has been vomiting dark fluids and passing dark stools, likely due to gastritis or gastric ulcers. We will check a CBC today. Start on Pantoprazole 40 mg BID. Refer to GI. He already has a visit scheduled with his PCP, Dr. Jerilee Hoh, on 03-25-20, and they can follow up at that time.  Alysia Penna, MD

## 2020-03-20 ENCOUNTER — Telehealth: Payer: Self-pay | Admitting: Family Medicine

## 2020-03-20 NOTE — Telephone Encounter (Signed)
Pt is calling in want to cancel the Rx pantoprazole (PROTONIX) 40 MG that never made it to the pharmacy and he has been taking OTC medication and it seem to be helping him.  Please cancel it at [facility] on [address].

## 2020-03-20 NOTE — Telephone Encounter (Signed)
Noted  

## 2020-03-25 ENCOUNTER — Encounter: Payer: Self-pay | Admitting: Internal Medicine

## 2020-03-25 ENCOUNTER — Ambulatory Visit (INDEPENDENT_AMBULATORY_CARE_PROVIDER_SITE_OTHER): Payer: BC Managed Care – PPO | Admitting: Internal Medicine

## 2020-03-25 ENCOUNTER — Other Ambulatory Visit: Payer: Self-pay

## 2020-03-25 ENCOUNTER — Other Ambulatory Visit: Payer: Self-pay | Admitting: Internal Medicine

## 2020-03-25 VITALS — BP 102/70 | HR 90 | Temp 98.2°F | Ht 67.5 in | Wt 195.7 lb

## 2020-03-25 DIAGNOSIS — Z1211 Encounter for screening for malignant neoplasm of colon: Secondary | ICD-10-CM

## 2020-03-25 DIAGNOSIS — E559 Vitamin D deficiency, unspecified: Secondary | ICD-10-CM

## 2020-03-25 DIAGNOSIS — Z0000 Encounter for general adult medical examination without abnormal findings: Secondary | ICD-10-CM

## 2020-03-25 DIAGNOSIS — E538 Deficiency of other specified B group vitamins: Secondary | ICD-10-CM | POA: Insufficient documentation

## 2020-03-25 DIAGNOSIS — F339 Major depressive disorder, recurrent, unspecified: Secondary | ICD-10-CM | POA: Diagnosis not present

## 2020-03-25 DIAGNOSIS — F17210 Nicotine dependence, cigarettes, uncomplicated: Secondary | ICD-10-CM | POA: Diagnosis not present

## 2020-03-25 DIAGNOSIS — E785 Hyperlipidemia, unspecified: Secondary | ICD-10-CM | POA: Insufficient documentation

## 2020-03-25 LAB — COMPREHENSIVE METABOLIC PANEL
ALT: 12 U/L (ref 0–53)
AST: 16 U/L (ref 0–37)
Albumin: 4.5 g/dL (ref 3.5–5.2)
Alkaline Phosphatase: 66 U/L (ref 39–117)
BUN: 9 mg/dL (ref 6–23)
CO2: 30 mEq/L (ref 19–32)
Calcium: 10.1 mg/dL (ref 8.4–10.5)
Chloride: 103 mEq/L (ref 96–112)
Creatinine, Ser: 0.91 mg/dL (ref 0.40–1.50)
GFR: 101.04 mL/min (ref >60.00)
Glucose, Bld: 80 mg/dL (ref 70–99)
Potassium: 5.5 mEq/L — ABNORMAL HIGH (ref 3.5–5.1)
Sodium: 142 mEq/L (ref 135–145)
Total Bilirubin: 0.5 mg/dL (ref 0.2–1.2)
Total Protein: 6.8 g/dL (ref 6.0–8.3)

## 2020-03-25 LAB — CBC WITH DIFFERENTIAL/PLATELET
Basophils Absolute: 0.1 10*3/uL (ref 0.0–0.1)
Basophils Relative: 1 % (ref 0.0–3.0)
Eosinophils Absolute: 0.3 10*3/uL (ref 0.0–0.7)
Eosinophils Relative: 3.3 % (ref 0.0–5.0)
HCT: 41.6 % (ref 39.0–52.0)
Hemoglobin: 14.2 g/dL (ref 13.0–17.0)
Lymphocytes Relative: 20.8 % (ref 12.0–46.0)
Lymphs Abs: 1.9 10*3/uL (ref 0.7–4.0)
MCHC: 34.1 g/dL (ref 30.0–36.0)
MCV: 89.1 fl (ref 78.0–100.0)
Monocytes Absolute: 0.7 10*3/uL (ref 0.1–1.0)
Monocytes Relative: 7.7 % (ref 3.0–12.0)
Neutro Abs: 6.3 10*3/uL (ref 1.4–7.7)
Neutrophils Relative %: 67.2 % (ref 43.0–77.0)
Platelets: 495 10*3/uL — ABNORMAL HIGH (ref 150.0–400.0)
RBC: 4.67 Mil/uL (ref 4.22–5.81)
RDW: 13.8 % (ref 11.5–15.5)
WBC: 9.3 10*3/uL (ref 4.0–10.5)

## 2020-03-25 LAB — VITAMIN D 25 HYDROXY (VIT D DEFICIENCY, FRACTURES): VITD: 18.99 ng/mL — ABNORMAL LOW (ref 30.00–100.00)

## 2020-03-25 LAB — LIPID PANEL
Cholesterol: 246 mg/dL — ABNORMAL HIGH (ref 0–200)
HDL: 47.1 mg/dL (ref >39.00)
LDL Cholesterol: 174 mg/dL — ABNORMAL HIGH (ref 0–99)
NonHDL: 199.04
Total CHOL/HDL Ratio: 5
Triglycerides: 127 mg/dL (ref 0.0–149.0)
VLDL: 25.4 mg/dL (ref 0.0–40.0)

## 2020-03-25 LAB — PSA: PSA: 2.78 ng/mL (ref 0.10–4.00)

## 2020-03-25 LAB — HEMOGLOBIN A1C: Hgb A1c MFr Bld: 5.1 % (ref 4.6–6.5)

## 2020-03-25 LAB — TSH: TSH: 1.23 u[IU]/mL (ref 0.35–4.50)

## 2020-03-25 LAB — VITAMIN B12: Vitamin B-12: 138 pg/mL — ABNORMAL LOW (ref 211–911)

## 2020-03-25 MED ORDER — VITAMIN D (ERGOCALCIFEROL) 1.25 MG (50000 UNIT) PO CAPS: 50000.0000 [IU] | ORAL_CAPSULE | ORAL | 0 refills | Status: AC

## 2020-03-25 NOTE — Progress Notes (Signed)
Established Patient Office Visit     This visit occurred during the SARS-CoV-2 public health emergency.  Safety protocols were in place, including screening questions prior to the visit, additional usage of staff PPE, and extensive cleaning of exam room while observing appropriate contact time as indicated for disinfecting solutions.    CC/Reason for Visit: Annual preventive exam  HPI: Christian Haley is a 45 y.o. male who is coming in today for the above mentioned reasons. 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  He is still smoking about 15 cigarettes a day has been trying to quit and has been using nicotine lozenges as well.  He has no acute complaints today.  He is willing to have a colonoscopy.  He has routine eye and dental care.  He exercises by walking 3 to 4 days a week.   Past Medical/Surgical History: Past Medical History:  Diagnosis Date  . Alcohol abuse   . Asperger syndrome   . Depression   . Nicotine dependence     No past surgical history on file.  Social History:  reports that he has been smoking cigarettes. He has a 20.00 pack-year smoking history. He has never used smokeless tobacco. He reports previous alcohol use. He reports that he does not use drugs.  Allergies: No Known Allergies  Family History:  Family History  Problem Relation Age of Onset  . Pancreatic cancer Father   . CVA Paternal Grandmother   . Lung cancer Paternal Grandfather      Current Outpatient Medications:  .  omeprazole (PRILOSEC OTC) 20 MG tablet, Take 20 mg by mouth. 2 tabs daily, Disp: , Rfl:  .  PARoxetine (PAXIL) 20 MG tablet, Take 20 mg by mouth 2 (two) times daily., Disp:  , Rfl:   Review of Systems:  Constitutional: Denies fever, chills, diaphoresis, appetite change and fatigue.  HEENT: Denies photophobia, eye pain, redness, hearing loss, ear pain, congestion, sore throat, rhinorrhea, sneezing, mouth sores, trouble swallowing, neck pain, neck stiffness and tinnitus.   Respiratory: Denies SOB, DOE, cough, chest tightness,  and wheezing.   Cardiovascular: Denies chest pain, palpitations and leg swelling.  Gastrointestinal: Denies nausea, vomiting, abdominal pain, diarrhea, constipation, blood in stool and abdominal distention.  Genitourinary: Denies dysuria, urgency, frequency, hematuria, flank pain and difficulty urinating.  Endocrine: Denies: hot or cold intolerance, sweats, changes in hair or nails, polyuria, polydipsia. Musculoskeletal: Denies myalgias, back pain, joint swelling, arthralgias and gait problem.  Skin: Denies pallor, rash and wound.  Neurological: Denies dizziness, seizures, syncope, weakness, light-headedness, numbness and headaches.  Hematological: Denies adenopathy. Easy bruising, personal or family bleeding history  Psychiatric/Behavioral: Denies suicidal ideation, mood changes, confusion, nervousness, sleep disturbance and agitation    Physical Exam: Vitals:   03/25/20 1112  BP: 102/70  Pulse: 90  Temp: 98.2 F (36.8 C)  TempSrc: Oral  SpO2: 97%  Weight: 195 lb 11.2 oz (88.8 kg)  Height: 5' 7.5" (1.715 m)    Body mass index is 30.2 kg/m.   Constitutional: NAD, calm, comfortable Eyes: PERRL, lids and conjunctivae normal ENMT: Mucous membranes are moist. Posterior pharynx clear of any exudate or lesions. Normal dentition. Tympanic membrane is pearly white, no erythema or bulging. Neck: normal, supple, no  masses, no thyromegaly Respiratory: clear to auscultation bilaterally, no wheezing, no crackles. Normal respiratory effort. No accessory muscle use.  Cardiovascular: Regular rate and rhythm, no murmurs / rubs / gallops. No  extremity edema. 2+ pedal pulses. No carotid bruits.  Abdomen: no tenderness, no masses palpated. No hepatosplenomegaly. Bowel sounds positive.  Musculoskeletal: no clubbing / cyanosis. No joint deformity upper and lower extremities. Good ROM, no contractures. Normal muscle tone.  Skin: no rashes, lesions, ulcers. No induration Neurologic: CN 2-12 grossly intact. Sensation intact, DTR normal. Strength 5/5 in all 4.  Psychiatric: Normal judgment and insight. Alert and oriented x 3. Normal mood.    Impression and Plan:  Encounter for preventive health examination  -He has routine eye and dental care. -All vaccinations are up-to-date including COVID x3 and influenza. -Screening labs today. -Healthy lifestyle discussed in detail. -PSA today for prostate cancer screening. -GI referral for screening colonoscopy today.  Colon cancer screening  - Plan: Ambulatory referral to Gastroenterology  Depression, recurrent [facility]) [place] Office Visit from 03/25/2020 in [place] [facility] [place]  PHQ-9 Total Score 1     -Mood is stable, followed by psychiatry.  Cigarette nicotine dependence without complication -I have discussed tobacco cessation with the patient.  I have counseled the patient regarding the negative impacts of continued tobacco use including but not limited to lung cancer, COPD, and cardiovascular disease.  I have discussed alternatives to tobacco and modalities that may help facilitate tobacco cessation including but not limited to biofeedback, hypnosis, and medications.  Total time spent with tobacco counseling was 30 minutes. -He is not interested in escalating his smoking cessation therapy at this time.     Patient Instructions   -Nice seeing you today!!  -Lab work today; will notify you once results are available.  -Schedule follow up in 6 months.   Preventive Care 79-61 Years Old, Male Preventive care refers to lifestyle choices and visits with your health  care provider that can promote health and wellness. This includes:  A yearly physical exam. This is also called an annual wellness visit.  Regular dental and eye exams.  Immunizations.  Screening for certain conditions.  Healthy lifestyle choices, such as: ? Eating a healthy diet. ? Getting regular exercise. ? Not using drugs or products that contain nicotine and tobacco. ? Limiting alcohol use. What can I expect for my preventive care visit? Physical exam Your health care provider will check your:  Height and weight. These may be used to calculate your BMI (body mass index). BMI is a measurement that tells if you are at a healthy weight.  Heart rate and blood pressure.  Body temperature.  Skin for abnormal spots. Counseling Your health care provider may ask you questions about your:  Past medical problems.  Family's medical history.  Alcohol, tobacco, and drug use.  Emotional well-being.  Home life and relationship well-being.  Sexual activity.  Diet, exercise, and sleep habits.  Work and work Statistician.  Access to firearms. What immunizations do I need? Vaccines are usually given at various ages, according to a schedule. Your health care provider will recommend vaccines for you based on your age, medical history, and lifestyle or other factors, such as travel or where you work.   What tests do I need? Blood tests  Lipid and cholesterol levels. These may be checked every 5 years, or more often if you are over 47 years old.  Hepatitis C test.  Hepatitis B test. Screening  Lung cancer screening. You  may have this screening every year starting at age 82 if you have a 30-pack-year history of smoking and currently smoke or have quit within the past 15 years.  Prostate cancer screening. Recommendations will vary depending on your family history and other risks.  Genital exam to check for testicular cancer or hernias.  Colorectal cancer screening. ? All  adults should have this screening starting at age 80 and continuing until age 27. ? Your health care provider may recommend screening at age 82 if you are at increased risk. ? You will have tests every 1-10 years, depending on your results and the type of screening test.  Diabetes screening. ? This is done by checking your blood sugar (glucose) after you have not eaten for a while (fasting). ? You may have this done every 1-3 years.  STD (sexually transmitted disease) testing, if you are at risk. Follow these instructions at home: Eating and drinking  Eat a diet that includes fresh fruits and vegetables, whole grains, lean protein, and low-fat dairy products.  Take vitamin and mineral supplements as recommended by your health care provider.  Do not drink alcohol if your health care provider tells you not to drink.  If you drink alcohol: ? Limit how much you have to 0-2 drinks a day. ? Be aware of how much alcohol is in your drink. In the [place] one drink equals one 12 oz bottle of beer (355 mL), one 5 oz glass of wine (148 mL), or one 1 oz glass of hard liquor (44 mL).   Lifestyle  Take daily care of your teeth and gums. Brush your teeth every morning and night with fluoride toothpaste. Floss one time each day.  Stay active. Exercise for at least 30 minutes 5 or more days each week.  Do not use any products that contain nicotine or tobacco, such as cigarettes, e-cigarettes, and chewing tobacco. If you need help quitting, ask your health care provider.  Do not use drugs.  If you are sexually active, practice safe sex. Use a condom or other form of protection to prevent STIs (sexually transmitted infections).  If told by your health care provider, take low-dose aspirin daily starting at age 84.  Find healthy ways to cope with stress, such as: ? Meditation, yoga, or listening to music. ? Journaling. ? Talking to a trusted person. ? Spending time with friends and  family. Safety  Always wear your seat belt while driving or riding in a vehicle.  Do not drive: ? If you have been drinking alcohol. Do not ride with someone who has been drinking. ? When you are tired or distracted. ? While texting.  Wear a helmet and other protective equipment during sports activities.  If you have firearms in your house, make sure you follow all gun safety procedures. What's next?  Go to your health care provider once a year for an annual wellness visit.  Ask your health care provider how often you should have your eyes and teeth checked.  Stay up to date on all vaccines. This information is not intended to replace advice given to you by your health care provider. Make sure you discuss any questions you have with your health care provider. Document Revised: 11/28/2018 Document Reviewed: 02/23/2018 Elsevier Patient Education  2021 [place], MD [facility]

## 2020-03-25 NOTE — Patient Instructions (Signed)
-Nice seeing you today!!  -Lab work today; will notify you once results are available.  -Schedule follow up in 6 months.   Preventive Care 26-47 Years Old, Male Preventive care refers to lifestyle choices and visits with your health care provider that can promote health and wellness. This includes:  A yearly physical exam. This is also called an annual wellness visit.  Regular dental and eye exams.  Immunizations.  Screening for certain conditions.  Healthy lifestyle choices, such as: ? Eating a healthy diet. ? Getting regular exercise. ? Not using drugs or products that contain nicotine and tobacco. ? Limiting alcohol use. What can I expect for my preventive care visit? Physical exam Your health care provider will check your:  Height and weight. These may be used to calculate your BMI (body mass index). BMI is a measurement that tells if you are at a healthy weight.  Heart rate and blood pressure.  Body temperature.  Skin for abnormal spots. Counseling Your health care provider may ask you questions about your:  Past medical problems.  Family's medical history.  Alcohol, tobacco, and drug use.  Emotional well-being.  Home life and relationship well-being.  Sexual activity.  Diet, exercise, and sleep habits.  Work and work Statistician.  Access to firearms. What immunizations do I need? Vaccines are usually given at various ages, according to a schedule. Your health care provider will recommend vaccines for you based on your age, medical history, and lifestyle or other factors, such as travel or where you work.   What tests do I need? Blood tests  Lipid and cholesterol levels. These may be checked every 5 years, or more often if you are over 35 years old.  Hepatitis C test.  Hepatitis B test. Screening  Lung cancer screening. You may have this screening every year starting at age 29 if you have a 30-pack-year history of smoking and currently smoke or  have quit within the past 15 years.  Prostate cancer screening. Recommendations will vary depending on your family history and other risks.  Genital exam to check for testicular cancer or hernias.  Colorectal cancer screening. ? All adults should have this screening starting at age 60 and continuing until age 98. ? Your health care provider may recommend screening at age 47 if you are at increased risk. ? You will have tests every 1-10 years, depending on your results and the type of screening test.  Diabetes screening. ? This is done by checking your blood sugar (glucose) after you have not eaten for a while (fasting). ? You may have this done every 1-3 years.  STD (sexually transmitted disease) testing, if you are at risk. Follow these instructions at home: Eating and drinking  Eat a diet that includes fresh fruits and vegetables, whole grains, lean protein, and low-fat dairy products.  Take vitamin and mineral supplements as recommended by your health care provider.  Do not drink alcohol if your health care provider tells you not to drink.  If you drink alcohol: ? Limit how much you have to 0-2 drinks a day. ? Be aware of how much alcohol is in your drink. In the [place] one drink equals one 12 oz bottle of beer (355 mL), one 5 oz glass of wine (148 mL), or one 1 oz glass of hard liquor (44 mL).   Lifestyle  Take daily care of your teeth and gums. Brush your teeth every morning and night with fluoride toothpaste. Floss one time each day.  Stay active. Exercise for at least 30 minutes 5 or more days each week.  Do not use any products that contain nicotine or tobacco, such as cigarettes, e-cigarettes, and chewing tobacco. If you need help quitting, ask your health care provider.  Do not use drugs.  If you are sexually active, practice safe sex. Use a condom or other form of protection to prevent STIs (sexually transmitted infections).  If told by your health care provider,  take low-dose aspirin daily starting at age 75.  Find healthy ways to cope with stress, such as: ? Meditation, yoga, or listening to music. ? Journaling. ? Talking to a trusted person. ? Spending time with friends and family. Safety  Always wear your seat belt while driving or riding in a vehicle.  Do not drive: ? If you have been drinking alcohol. Do not ride with someone who has been drinking. ? When you are tired or distracted. ? While texting.  Wear a helmet and other protective equipment during sports activities.  If you have firearms in your house, make sure you follow all gun safety procedures. What's next?  Go to your health care provider once a year for an annual wellness visit.  Ask your health care provider how often you should have your eyes and teeth checked.  Stay up to date on all vaccines. This information is not intended to replace advice given to you by your health care provider. Make sure you discuss any questions you have with your health care provider. Document Revised: 11/28/2018 Document Reviewed: 02/23/2018 Elsevier Patient Education  2021 [employer].

## 2020-03-25 NOTE — Addendum Note (Signed)
Addended by: Janann Colonel on: 03/25/2020 11:54 AM   Modules accepted: Orders

## 2020-03-27 ENCOUNTER — Other Ambulatory Visit: Payer: Self-pay | Admitting: Internal Medicine

## 2020-03-27 DIAGNOSIS — E559 Vitamin D deficiency, unspecified: Secondary | ICD-10-CM

## 2020-03-27 DIAGNOSIS — E785 Hyperlipidemia, unspecified: Secondary | ICD-10-CM

## 2020-03-28 ENCOUNTER — Telehealth: Payer: Self-pay | Admitting: Internal Medicine

## 2020-03-28 NOTE — Telephone Encounter (Signed)
Spoke with patient.  Nothing further needed

## 2020-03-28 NOTE — Telephone Encounter (Signed)
Pt call and want a call back to talk about his colonoscopy that he scheduled his self.

## 2020-04-03 ENCOUNTER — Ambulatory Visit (INDEPENDENT_AMBULATORY_CARE_PROVIDER_SITE_OTHER): Payer: BC Managed Care – PPO | Admitting: *Deleted

## 2020-04-03 ENCOUNTER — Other Ambulatory Visit: Payer: Self-pay

## 2020-04-03 DIAGNOSIS — E538 Deficiency of other specified B group vitamins: Secondary | ICD-10-CM | POA: Diagnosis not present

## 2020-04-03 MED ORDER — CYANOCOBALAMIN 1000 MCG/ML IJ SOLN
1000.0000 ug | Freq: Once | INTRAMUSCULAR | Status: AC
  Administered 2020-04-03: 1000 ug via INTRAMUSCULAR

## 2020-04-03 NOTE — Progress Notes (Signed)
Per orders of Dr. Hernandez, injection of b12 given by Nylani Michetti. Patient tolerated injection well.  

## 2020-04-10 ENCOUNTER — Ambulatory Visit (INDEPENDENT_AMBULATORY_CARE_PROVIDER_SITE_OTHER): Payer: BC Managed Care – PPO | Admitting: *Deleted

## 2020-04-10 ENCOUNTER — Other Ambulatory Visit: Payer: Self-pay

## 2020-04-10 DIAGNOSIS — E538 Deficiency of other specified B group vitamins: Secondary | ICD-10-CM

## 2020-04-10 MED ORDER — CYANOCOBALAMIN 1000 MCG/ML IJ SOLN
1000.0000 ug | Freq: Once | INTRAMUSCULAR | Status: AC
  Administered 2020-04-10: 1000 ug via INTRAMUSCULAR

## 2020-04-10 NOTE — Progress Notes (Signed)
Per orders of Dr. Hernandez, injection of B12 given by Clemons Salvucci. Patient tolerated injection well.  

## 2020-04-17 ENCOUNTER — Ambulatory Visit (INDEPENDENT_AMBULATORY_CARE_PROVIDER_SITE_OTHER): Payer: BC Managed Care – PPO | Admitting: *Deleted

## 2020-04-17 ENCOUNTER — Other Ambulatory Visit: Payer: Self-pay

## 2020-04-17 DIAGNOSIS — E538 Deficiency of other specified B group vitamins: Secondary | ICD-10-CM

## 2020-04-17 MED ORDER — CYANOCOBALAMIN 1000 MCG/ML IJ SOLN
1000.0000 ug | Freq: Once | INTRAMUSCULAR | Status: AC
  Administered 2020-04-17: 1000 ug via INTRAMUSCULAR

## 2020-04-17 NOTE — Progress Notes (Signed)
Per orders of Dr. Hernandez, injection of B12 given by Jenah Vanasten. Patient tolerated injection well.  

## 2020-04-22 ENCOUNTER — Ambulatory Visit: Payer: BC Managed Care – PPO

## 2020-04-24 ENCOUNTER — Other Ambulatory Visit: Payer: Self-pay

## 2020-04-24 ENCOUNTER — Ambulatory Visit (INDEPENDENT_AMBULATORY_CARE_PROVIDER_SITE_OTHER): Payer: BC Managed Care – PPO | Admitting: *Deleted

## 2020-04-24 DIAGNOSIS — E538 Deficiency of other specified B group vitamins: Secondary | ICD-10-CM

## 2020-04-24 MED ORDER — CYANOCOBALAMIN 1000 MCG/ML IJ SOLN
1000.0000 ug | Freq: Once | INTRAMUSCULAR | Status: AC
  Administered 2020-04-24: 1000 ug via INTRAMUSCULAR

## 2020-04-30 NOTE — Progress Notes (Signed)
Per orders of Dr. Hernandez, injection of B12 given by Rachel Vereen. Patient tolerated injection well.  

## 2020-05-13 ENCOUNTER — Other Ambulatory Visit: Payer: Self-pay

## 2020-05-13 ENCOUNTER — Ambulatory Visit (AMBULATORY_SURGERY_CENTER): Payer: Self-pay | Admitting: *Deleted

## 2020-05-13 VITALS — Ht 67.5 in | Wt 194.0 lb

## 2020-05-13 DIAGNOSIS — Z1211 Encounter for screening for malignant neoplasm of colon: Secondary | ICD-10-CM

## 2020-05-13 MED ORDER — PLENVU 140 G PO SOLR: 1.0000 | Freq: Once | ORAL | 0 refills | Status: AC

## 2020-05-13 NOTE — Addendum Note (Signed)
Addended by: Christell Constant F on: 05/13/2020 01:41 PM   Modules accepted: Level of Service

## 2020-05-13 NOTE — Progress Notes (Signed)
No egg or soy allergy known to patient  No issues with past sedation with any surgeries or procedures No intubation problems in the past  No FH of Malignant Hyperthermia No diet pills per patient No home 02 use per patient  No blood thinners per patient  Pt denies issues with constipation  No A fib or A flutter  EMMI video to pt or via [place] 19 guidelines implemented in PV today with Pt and RN  Pt is fully vaccinated  for Covid  Pt denies loose or missing teeth, denies dentures, partials, dental implants, capped or bonded teeth   Coupon given to pt in PV today , Code to Pharmacy and  NO PA's for preps discussed with pt In PV today  Discussed with pt there will be an out-of-pocket cost for prep and that varies from $0 to 70 dollars   Due to the COVID-19 pandemic we are asking patients to follow certain guidelines.  Pt aware of COVID protocols and [facility] guidelines

## 2020-05-15 ENCOUNTER — Encounter: Payer: Self-pay | Admitting: Gastroenterology

## 2020-05-21 ENCOUNTER — Other Ambulatory Visit: Payer: Self-pay

## 2020-05-22 ENCOUNTER — Other Ambulatory Visit: Payer: Self-pay

## 2020-05-22 ENCOUNTER — Ambulatory Visit (INDEPENDENT_AMBULATORY_CARE_PROVIDER_SITE_OTHER): Payer: BC Managed Care – PPO | Admitting: *Deleted

## 2020-05-22 DIAGNOSIS — E538 Deficiency of other specified B group vitamins: Secondary | ICD-10-CM

## 2020-05-22 MED ORDER — CYANOCOBALAMIN 1000 MCG/ML IJ SOLN
1000.0000 ug | Freq: Once | INTRAMUSCULAR | Status: AC
  Administered 2020-05-22: 1000 ug via INTRAMUSCULAR

## 2020-05-22 NOTE — Progress Notes (Signed)
Per orders of Dr. Hernandez, injection of Cyanocobalamin 1000mcg given by Funderburk, Jo A. Patient tolerated injection well. 

## 2020-05-27 ENCOUNTER — Encounter: Payer: Self-pay | Admitting: Gastroenterology

## 2020-05-27 ENCOUNTER — Ambulatory Visit (AMBULATORY_SURGERY_CENTER): Payer: BC Managed Care – PPO | Admitting: Gastroenterology

## 2020-05-27 ENCOUNTER — Other Ambulatory Visit: Payer: Self-pay

## 2020-05-27 VITALS — BP 118/83 | HR 73 | Temp 97.5°F | Resp 14 | Ht 67.0 in | Wt 194.0 lb

## 2020-05-27 DIAGNOSIS — D124 Benign neoplasm of descending colon: Secondary | ICD-10-CM

## 2020-05-27 DIAGNOSIS — Z1211 Encounter for screening for malignant neoplasm of colon: Secondary | ICD-10-CM

## 2020-05-27 DIAGNOSIS — D125 Benign neoplasm of sigmoid colon: Secondary | ICD-10-CM

## 2020-05-27 DIAGNOSIS — D123 Benign neoplasm of transverse colon: Secondary | ICD-10-CM

## 2020-05-27 DIAGNOSIS — K635 Polyp of colon: Secondary | ICD-10-CM | POA: Diagnosis not present

## 2020-05-27 HISTORY — PX: COLONOSCOPY: SHX174

## 2020-05-27 MED ORDER — SODIUM CHLORIDE 0.9 % IV SOLN: 500.0000 mL | Freq: Once | INTRAVENOUS | Status: DC

## 2020-05-27 NOTE — Progress Notes (Signed)
Pt's states no medical or surgical changes since previsit or office visit. 

## 2020-05-27 NOTE — Progress Notes (Signed)
1230 IV infiltrated 1242 IV restarted

## 2020-05-27 NOTE — Progress Notes (Signed)
C.W. vital signs. 

## 2020-05-27 NOTE — Progress Notes (Signed)
A/ox3, pleased with MAC, report to RN 

## 2020-05-27 NOTE — Progress Notes (Signed)
Luisantonio Adinolfi CRNA relieves [employer]

## 2020-05-27 NOTE — Patient Instructions (Signed)
Handouts provided on polyps, diverticulosis and hemorrhoids.   Clip card provided. Keep this card with you in case you need an MRI over the next few months.   No aspirin, ibuprofen, naproxen, or other non-steriodal anti-inflammatory drugs for 7 days after polyp removal.   Repeat colonoscopy in 6 months for further polypectomy/ablation. My office will contact you to schedule this.   YOU HAD AN ENDOSCOPIC PROCEDURE TODAY AT [facility]:   Refer to the procedure report that was given to you for any specific questions about what was found during the examination.  If the procedure report does not answer your questions, please call your gastroenterologist to clarify.  If you requested that your care partner not be given the details of your procedure findings, then the procedure report has been included in a sealed envelope for you to review at your convenience later.  YOU SHOULD EXPECT: Some feelings of bloating in the abdomen. Passage of more gas than usual.  Walking can help get rid of the air that was put into your GI tract during the procedure and reduce the bloating. If you had a lower endoscopy (such as a colonoscopy or flexible sigmoidoscopy) you may notice spotting of blood in your stool or on the toilet paper. If you underwent a bowel prep for your procedure, you may not have a normal bowel movement for a few days.  Please Note:  You might notice some irritation and congestion in your nose or some drainage.  This is from the oxygen used during your procedure.  There is no need for concern and it should clear up in a day or so.  SYMPTOMS TO REPORT IMMEDIATELY:   Following lower endoscopy (colonoscopy or flexible sigmoidoscopy):  Excessive amounts of blood in the stool  Significant tenderness or worsening of abdominal pains  Swelling of the abdomen that is new, acute  Fever of 100F or higher  For urgent or emergent issues, a gastroenterologist can be reached at any hour by  calling [phone]. Do not use MyChart messaging for urgent concerns.    DIET:  We do recommend a small meal at first, but then you may proceed to your regular diet.  Drink plenty of fluids but you should avoid alcoholic beverages for 24 hours.  ACTIVITY:  You should plan to take it easy for the rest of today and you should NOT DRIVE or use heavy machinery until tomorrow (because of the sedation medicines used during the test).    FOLLOW UP: Our staff will call the number listed on your records 48-72 hours following your procedure to check on you and address any questions or concerns that you may have regarding the information given to you following your procedure. If we do not reach you, we will leave a message.  We will attempt to reach you two times.  During this call, we will ask if you have developed any symptoms of COVID 19. If you develop any symptoms (ie: fever, flu-like symptoms, shortness of breath, cough etc.) before then, please call [phone].  If you test positive for Covid 19 in the 2 weeks post procedure, please call and report this information to [place].    If any biopsies were taken you will be contacted by phone or by letter within the next 1-3 weeks.  Please call us at [phone] if you have not heard about the biopsies in 3 weeks.    SIGNATURES/CONFIDENTIALITY: You and/or your care partner have signed paperwork which will  be entered into your electronic medical record.  These signatures attest to the fact that that the information above on your After Visit Summary has been reviewed and is understood.  Full responsibility of the confidentiality of this discharge information lies with you and/or your care-partner.

## 2020-05-27 NOTE — Op Note (Signed)
[place] Patient Name: Christian Haley Procedure Date: 05/27/2020 12:11 PM MRN: [id number] Endoscopist: Mallie Mussel L. Loletha Carrow , MD Age: 45 Referring MD:  Date of Birth: Jul 08, 1975 Gender: Male Account #: [id number] Procedure:                Colonoscopy Indications:              Screening for colorectal malignant neoplasm, This                            is the patient's first colonoscopy Medicines:                Monitored Anesthesia Care Procedure:                Pre-Anesthesia Assessment:                           - Prior to the procedure, a History and Physical                            was performed, and patient medications and                            allergies were reviewed. The patient's tolerance of                            previous anesthesia was also reviewed. The risks                            and benefits of the procedure and the sedation                            options and risks were discussed with the patient.                            All questions were answered, and informed consent                            was obtained. Prior Anticoagulants: The patient has                            taken no previous anticoagulant or antiplatelet                            agents. ASA Grade Assessment: II - A patient with                            mild systemic disease. After reviewing the risks                            and benefits, the patient was deemed in                            satisfactory condition to undergo the procedure.  After obtaining informed consent, the colonoscope                            was passed under direct vision. Throughout the                            procedure, the patient's blood pressure, pulse, and                            oxygen saturations were monitored continuously. The                            Olympus CF-HQ190L ([id number]) Colonoscope was                            introduced through the anus and  advanced to the the                            cecum, identified by appendiceal orifice and                            ileocecal valve. The colonoscopy was performed with                            difficulty due to multiple diverticula in the                            colon, a redundant colon and significant looping.                            The patient tolerated the procedure fairly well (IV                            infiltrated during scope withdrawal and required                            replacement). The quality of the bowel preparation                            was fair in the left colon, good in the right                            colon. The ileocecal valve, appendiceal orifice,                            and rectum were photographed. The bowel preparation                            used was Plenvu. Scope In: 12:18:43 PM Scope Out: 1:17:06 PM Scope Withdrawal Time: 0 hours 55 minutes 6 seconds  Total Procedure Duration: 0 hours 58 minutes 23 seconds  Findings:                 The perianal and digital rectal examinations were  normal.                           A 25 mm polyp was found in the transverse colon.                            The polyp was semi-sessile. Preparations were made                            for mucosal resection. Saline was injected to raise                            the lesion. Snare mucosal resection was performed.                            Resection was incomplete.(saline lift dissipated,                            adequate position lost due to scope looping and                            spasm. To prevent bleeding post-intervention, one                            hemostatic clip was successfully placed (MR                            conditional). Area was tattooed with an injection                            of 0.5 mL of [place] ink.                           A 10 mm polyp was found in the descending colon.                             The polyp was sessile. The polyp was removed with a                            hot snare. Resection and retrieval were complete.                           A diminutive polyp was found in the recto-sigmoid                            colon. The polyp was sessile. The polyp was removed                            with a cold snare. Resection and retrieval were                            complete.  Many diverticula were found in the left colon and                            right colon.                           Internal hemorrhoids were found.                           The exam was otherwise without abnormality on                            direct and retroflexion views. Complications:            No immediate complications. Estimated Blood Loss:     Estimated blood loss was minimal. Impression:               - Preparation of the colon was fair.                           - One 25 mm polyp in the transverse colon, removed                            with mucosal resection. Incomplete resection. Clip                            (MR conditional) was placed. Tattooed.                           - One 10 mm polyp in the descending colon, removed                            with a hot snare. Resected and retrieved.                           - One diminutive polyp at the recto-sigmoid colon,                            removed with a cold snare. Resected and retrieved.                           - Diverticulosis in the left colon and in the right                            colon.                           - Internal hemorrhoids.                           - The examination was otherwise normal on direct                            and retroflexion views.                           -  Mucosal resection was performed. Resection was                            incomplete. Recommendation:           - Patient has a contact number available for                             emergencies. The signs and symptoms of potential                            delayed complications were discussed with the                            patient. Return to normal activities tomorrow.                            Written discharge instructions were provided to the                            patient.                           - Resume previous diet.                           - No aspirin, ibuprofen, naproxen, or other                            non-steroidal anti-inflammatory drugs for 7 days                            after polyp removal.                           - Await pathology results.                           - Repeat colonoscopy in 6 months further                            polypectomy/ablation. (next colonoscopy in hospital                            outpatient endoscopy lab; consume more water with                            bowel prep for next exam) Mallie Mussel L. Loletha Carrow, MD 05/27/2020 1:28:05 PM This report has been signed electronically.

## 2020-05-29 ENCOUNTER — Telehealth: Payer: Self-pay | Admitting: *Deleted

## 2020-05-29 NOTE — Telephone Encounter (Signed)
Left message on f/u call 

## 2020-05-29 NOTE — Telephone Encounter (Signed)
Patient called returning a call said he is doing well no questions at this time.

## 2020-06-03 ENCOUNTER — Encounter: Payer: Self-pay | Admitting: Gastroenterology

## 2020-06-20 ENCOUNTER — Other Ambulatory Visit: Payer: Self-pay

## 2020-06-23 ENCOUNTER — Other Ambulatory Visit: Payer: Self-pay

## 2020-06-23 ENCOUNTER — Ambulatory Visit (INDEPENDENT_AMBULATORY_CARE_PROVIDER_SITE_OTHER): Payer: BC Managed Care – PPO

## 2020-06-23 DIAGNOSIS — E538 Deficiency of other specified B group vitamins: Secondary | ICD-10-CM

## 2020-06-23 MED ORDER — CYANOCOBALAMIN 1000 MCG/ML IJ SOLN
1000.0000 ug | Freq: Once | INTRAMUSCULAR | Status: AC
  Administered 2020-06-23: 1000 ug via INTRAMUSCULAR

## 2020-06-23 NOTE — Progress Notes (Signed)
Per orders of Dr. Jordan, injection of B12 given by Darrian Goodwill E Ulysses Alper. Patient tolerated injection well.  

## 2020-07-22 ENCOUNTER — Other Ambulatory Visit: Payer: Self-pay

## 2020-07-23 ENCOUNTER — Ambulatory Visit (INDEPENDENT_AMBULATORY_CARE_PROVIDER_SITE_OTHER): Payer: BC Managed Care – PPO | Admitting: *Deleted

## 2020-07-23 DIAGNOSIS — E538 Deficiency of other specified B group vitamins: Secondary | ICD-10-CM

## 2020-07-23 MED ORDER — CYANOCOBALAMIN 1000 MCG/ML IJ SOLN
1000.0000 ug | Freq: Once | INTRAMUSCULAR | Status: AC
  Administered 2020-07-23: 1000 ug via INTRAMUSCULAR

## 2020-07-23 NOTE — Progress Notes (Signed)
Per orders of Dr. Hernandez, injection of B12 given by Silvestre Mines. Patient tolerated injection well.  

## 2020-08-07 DIAGNOSIS — F0631 Mood disorder due to known physiological condition with depressive features: Secondary | ICD-10-CM | POA: Diagnosis not present

## 2020-08-07 DIAGNOSIS — F840 Autistic disorder: Secondary | ICD-10-CM | POA: Diagnosis not present

## 2020-08-07 DIAGNOSIS — F064 Anxiety disorder due to known physiological condition: Secondary | ICD-10-CM | POA: Diagnosis not present

## 2020-08-22 ENCOUNTER — Other Ambulatory Visit: Payer: Self-pay

## 2020-08-25 ENCOUNTER — Ambulatory Visit (INDEPENDENT_AMBULATORY_CARE_PROVIDER_SITE_OTHER): Payer: BC Managed Care – PPO

## 2020-08-25 ENCOUNTER — Other Ambulatory Visit: Payer: Self-pay

## 2020-08-25 DIAGNOSIS — E538 Deficiency of other specified B group vitamins: Secondary | ICD-10-CM | POA: Diagnosis not present

## 2020-08-25 MED ORDER — CYANOCOBALAMIN 1000 MCG/ML IJ SOLN
1000.0000 ug | Freq: Once | INTRAMUSCULAR | Status: AC
  Administered 2020-08-25: 1000 ug via INTRAMUSCULAR

## 2020-08-25 NOTE — Patient Instructions (Signed)
Health Maintenance Due  Topic Date Due   Pneumococcal Vaccine 52-47 Years old (1 - PCV) Never done   HIV Screening  Never done   Hepatitis C Screening  Never done    Depression screen [facility] 2/9 03/25/2020 11/15/2019 07/30/2013  Decreased Interest 0 1 0  Down, Depressed, Hopeless 1 1 0  PHQ - 2 Score 1 2 0  Altered sleeping 0 0 -  Tired, decreased energy 0 0 -  Change in appetite 0 0 -  Feeling bad or failure about yourself  0 0 -  Trouble concentrating 0 1 -  Moving slowly or fidgety/restless 0 1 -  Suicidal thoughts 0 0 -  PHQ-9 Score 1 4 -  Difficult doing work/chores Not difficult at all Not difficult at all -

## 2020-09-01 NOTE — Progress Notes (Addendum)
Per orders of Isaac Bliss, Rayford Halsted, MD, injection of B12 given in   deltoid by Franco Collet. Patient tolerated injection well.  Lab Results  Component Value Date   VITAMINB12 138 (L) 03/25/2020

## 2020-09-23 ENCOUNTER — Other Ambulatory Visit: Payer: Self-pay

## 2020-09-23 ENCOUNTER — Encounter: Payer: Self-pay | Admitting: Internal Medicine

## 2020-09-23 ENCOUNTER — Ambulatory Visit (INDEPENDENT_AMBULATORY_CARE_PROVIDER_SITE_OTHER): Payer: BC Managed Care – PPO | Admitting: Internal Medicine

## 2020-09-23 VITALS — BP 110/70 | HR 97 | Temp 98.3°F | Wt 194.3 lb

## 2020-09-23 DIAGNOSIS — E538 Deficiency of other specified B group vitamins: Secondary | ICD-10-CM | POA: Diagnosis not present

## 2020-09-23 DIAGNOSIS — F339 Major depressive disorder, recurrent, unspecified: Secondary | ICD-10-CM

## 2020-09-23 DIAGNOSIS — F17210 Nicotine dependence, cigarettes, uncomplicated: Secondary | ICD-10-CM | POA: Diagnosis not present

## 2020-09-23 DIAGNOSIS — Z1211 Encounter for screening for malignant neoplasm of colon: Secondary | ICD-10-CM

## 2020-09-23 DIAGNOSIS — E782 Mixed hyperlipidemia: Secondary | ICD-10-CM | POA: Diagnosis not present

## 2020-09-23 DIAGNOSIS — E559 Vitamin D deficiency, unspecified: Secondary | ICD-10-CM

## 2020-09-23 LAB — LIPID PANEL
Cholesterol: 235 mg/dL — ABNORMAL HIGH (ref 0–200)
HDL: 44.7 mg/dL (ref >39.00)
LDL Cholesterol: 164 mg/dL — ABNORMAL HIGH (ref 0–99)
NonHDL: 190.66
Total CHOL/HDL Ratio: 5
Triglycerides: 132 mg/dL (ref 0.0–149.0)
VLDL: 26.4 mg/dL (ref 0.0–40.0)

## 2020-09-23 LAB — VITAMIN D 25 HYDROXY (VIT D DEFICIENCY, FRACTURES): VITD: 19.12 ng/mL — ABNORMAL LOW (ref 30.00–100.00)

## 2020-09-23 LAB — VITAMIN B12: Vitamin B-12: 259 pg/mL (ref 211–911)

## 2020-09-23 NOTE — Addendum Note (Signed)
Addended by: Tessie Fass D on: 09/23/2020 02:28 PM   Modules accepted: Orders

## 2020-09-23 NOTE — Patient Instructions (Signed)
-  Nice seeing you today!!  -Lab work today; will notify you once results are available.  -Schedule follow up in 6 months for your physical.

## 2020-09-23 NOTE — Progress Notes (Signed)
Established Patient Office Visit     This visit occurred during the SARS-CoV-2 public health emergency.  Safety protocols were in place, including screening questions prior to the visit, additional usage of staff PPE, and extensive cleaning of exam room while observing appropriate contact time as indicated for disinfecting solutions.    CC/Reason for Visit: 44-month follow-up chronic medical conditions  HPI: Christian Haley is a 45 y.o. male who is coming in today for the above mentioned reasons. Past Medical History is significant for: Depression, autism spectrum disorder, prior history of alcohol dependence, current nicotine dependence, hyperlipidemia not yet on medication, as well as vitamin D and B12 deficiencies.  He has no acute complaints today.  He has had 3 COVID vaccines.  He had a colonoscopy in March and was recommended a 57-month follow-up due to incomplete polypectomy.  He is aware that this is to be scheduled for around September.   Past Medical/Surgical History: Past Medical History:  Diagnosis Date   Alcohol abuse    Asperger syndrome    Depression    Nicotine dependence     Past Surgical History:  Procedure Laterality Date   TONSILLECTOMY      Social History:  reports that he has been smoking cigarettes. He has a 20.00 pack-year smoking history. He has never used smokeless tobacco. He reports previous alcohol use. He reports that he does not use drugs.  Allergies: No Known Allergies  Family History:  Family History  Problem Relation Age of Onset   Pancreatic cancer Father    CVA Paternal Grandmother    Lung cancer Paternal Grandfather      Current Outpatient Medications:    PARoxetine (PAXIL) 20 MG tablet, Take 20 mg by mouth 2 (two) times daily., Disp: , Rfl:   Review of Systems:  Constitutional: Denies fever, chills, diaphoresis, appetite change and fatigue.  HEENT: Denies photophobia, eye pain, redness, hearing loss, ear pain, congestion,  sore throat, rhinorrhea, sneezing, mouth sores, trouble swallowing, neck pain, neck stiffness and tinnitus.   Respiratory: Denies SOB, DOE, cough, chest tightness,  and wheezing.   Cardiovascular: Denies chest pain, palpitations and leg swelling.  Gastrointestinal: Denies nausea, vomiting, abdominal pain, diarrhea, constipation, blood in stool and abdominal distention.  Genitourinary: Denies dysuria, urgency, frequency, hematuria, flank pain and difficulty urinating.  Endocrine: Denies: hot or cold intolerance, sweats, changes in hair or nails, polyuria, polydipsia. Musculoskeletal: Denies myalgias, back pain, joint swelling, arthralgias and gait problem.  Skin: Denies pallor, rash and wound.  Neurological: Denies dizziness, seizures, syncope, weakness, light-headedness, numbness and headaches.  Hematological: Denies adenopathy. Easy bruising, personal or family bleeding history  Psychiatric/Behavioral: Denies suicidal ideation, mood changes, confusion, nervousness, sleep disturbance and agitation    Physical Exam: Vitals:   09/23/20 1400  BP: 110/70  Pulse: 97  Temp: 98.3 F (36.8 C)  TempSrc: Oral  SpO2: 98%  Weight: 194 lb 4.8 oz (88.1 kg)    Body mass index is 30.43 kg/m.   Constitutional: NAD, calm, comfortable Eyes: PERRL, lids and conjunctivae normal ENMT: Mucous membranes are moist.  Respiratory: clear to auscultation bilaterally, no wheezing, no crackles. Normal respiratory effort. No accessory muscle use.  Cardiovascular: Regular rate and rhythm, no murmurs / rubs / gallops. No extremity edema.  Psychiatric: Normal judgment and insight. Alert and oriented x 3. Normal mood.    Impression and Plan:  Colon polyps -He knows to go back to GI around September for his redo colonoscopy.  Mixed hyperlipidemia  -  Plan: Lipid panel  Cigarette nicotine dependence without complication -He continues to smoke about 10 to 12 cigarettes a day in addition to using nicotine  lozenges.  He is not interested in full smoking cessation today.  Vitamin B12 deficiency  - Plan: Vitamin B12  Vitamin D deficiency  - Plan: VITAMIN D 25 Hydroxy (Vit-D Deficiency, Fractures)  Depression, recurrent (HCC) -Mood appears stable, on paroxetine 20 mg.  Time spent: 31 minutes reviewing chart, interviewing and examining patient and formulating plan of care.   Patient Instructions  -Nice seeing you today!!  -Lab work today; will notify you once results are available.  -Schedule follow up in 6 months for your physical.    Lelon Frohlich, MD [facility]

## 2020-09-24 ENCOUNTER — Other Ambulatory Visit: Payer: Self-pay | Admitting: Internal Medicine

## 2020-09-24 ENCOUNTER — Telehealth: Payer: Self-pay | Admitting: Internal Medicine

## 2020-09-24 DIAGNOSIS — E559 Vitamin D deficiency, unspecified: Secondary | ICD-10-CM

## 2020-09-24 DIAGNOSIS — E782 Mixed hyperlipidemia: Secondary | ICD-10-CM

## 2020-09-24 MED ORDER — ATORVASTATIN CALCIUM 40 MG PO TABS: 40.0000 mg | ORAL_TABLET | Freq: Every day | ORAL | 1 refills | Status: DC

## 2020-09-24 MED ORDER — VITAMIN D (ERGOCALCIFEROL) 1.25 MG (50000 UNIT) PO CAPS: 50000.0000 [IU] | ORAL_CAPSULE | ORAL | 0 refills | Status: AC

## 2020-09-24 NOTE — Telephone Encounter (Signed)
Left message on machine for patient to return our call.  See lab result note. 

## 2020-09-24 NOTE — Telephone Encounter (Signed)
Patient called for lab results and wanted to know if Dr. Jerilee Hoh was calling something in for him.  Please advise

## 2020-09-26 ENCOUNTER — Other Ambulatory Visit: Payer: Self-pay | Admitting: Internal Medicine

## 2020-09-26 DIAGNOSIS — E559 Vitamin D deficiency, unspecified: Secondary | ICD-10-CM

## 2020-09-30 ENCOUNTER — Other Ambulatory Visit: Payer: Self-pay

## 2020-10-01 ENCOUNTER — Ambulatory Visit (INDEPENDENT_AMBULATORY_CARE_PROVIDER_SITE_OTHER): Payer: BC Managed Care – PPO | Admitting: *Deleted

## 2020-10-01 DIAGNOSIS — E538 Deficiency of other specified B group vitamins: Secondary | ICD-10-CM | POA: Diagnosis not present

## 2020-10-01 MED ORDER — CYANOCOBALAMIN 1000 MCG/ML IJ SOLN
1000.0000 ug | Freq: Once | INTRAMUSCULAR | Status: AC
  Administered 2020-10-01: 1000 ug via INTRAMUSCULAR

## 2020-10-01 NOTE — Progress Notes (Signed)
Per orders of Dr. Hernandez, injection of Cyanocobalamin 1000mcg given by Gwenn Teodoro A. Patient tolerated injection well. 

## 2020-10-02 NOTE — Progress Notes (Signed)
Per orders of Dr. Hernandez, injection of Cyanocobalamin 1000mcg given by Brissa Asante A. Patient tolerated injection well. 

## 2020-10-31 ENCOUNTER — Other Ambulatory Visit: Payer: Self-pay

## 2020-11-03 ENCOUNTER — Ambulatory Visit (INDEPENDENT_AMBULATORY_CARE_PROVIDER_SITE_OTHER): Payer: BC Managed Care – PPO

## 2020-11-03 ENCOUNTER — Other Ambulatory Visit: Payer: Self-pay

## 2020-11-03 ENCOUNTER — Telehealth: Payer: Self-pay | Admitting: Internal Medicine

## 2020-11-03 ENCOUNTER — Telehealth: Payer: Self-pay | Admitting: *Deleted

## 2020-11-03 DIAGNOSIS — E538 Deficiency of other specified B group vitamins: Secondary | ICD-10-CM | POA: Diagnosis not present

## 2020-11-03 MED ORDER — CYANOCOBALAMIN 1000 MCG/ML IJ SOLN
1000.0000 ug | Freq: Once | INTRAMUSCULAR | Status: AC
  Administered 2020-11-03: 1000 ug via INTRAMUSCULAR

## 2020-11-03 NOTE — Telephone Encounter (Signed)
Patient is wanting to know when he can come in for his lab work when he finishes his vitamin D.  Patient's contact number is [phone].  Please advise.

## 2020-11-03 NOTE — Progress Notes (Signed)
Per orders of Dr. Volanda Napoleon, injection of B12 given by Rodrigo Ran. Patient tolerated injection well.

## 2020-11-03 NOTE — Telephone Encounter (Signed)
[place],  This pt has been scheduled for a 6 month recall colon in the [facility], however in his colon report 05-27-2020, Dr Loletha Carrow states below-  Repeat colonoscopy in 6 months further polypectomy/ablation. (next colonoscopy in hospital outpatient endoscopy lab; consume more water with bowel prep for next exam)  Please schedule pt  at [facility]- please call pt and schedule with him to avoid multiple calls due to scheduling issues   He has a PV 11-20-2020- I will cancel his [facility] colon   Thanks, so much --- Marijean Niemann

## 2020-11-04 NOTE — Telephone Encounter (Signed)
Left message on machine for patient.  He can schedule a lab appointment at any time.

## 2020-11-05 NOTE — Telephone Encounter (Signed)
Lm on vm for patient to return call 

## 2020-11-06 NOTE — Telephone Encounter (Signed)
Pt returned call stating that he has an appt on 9/22 for B12 injection. Pt states that he can do his procedure the week of 12/08/20. Discussing hospital dates with Dr. Loletha Carrow.

## 2020-11-10 ENCOUNTER — Other Ambulatory Visit: Payer: Self-pay

## 2020-11-10 DIAGNOSIS — Z1211 Encounter for screening for malignant neoplasm of colon: Secondary | ICD-10-CM

## 2020-11-10 DIAGNOSIS — D125 Benign neoplasm of sigmoid colon: Secondary | ICD-10-CM

## 2020-11-10 DIAGNOSIS — D123 Benign neoplasm of transverse colon: Secondary | ICD-10-CM

## 2020-11-10 DIAGNOSIS — D124 Benign neoplasm of descending colon: Secondary | ICD-10-CM

## 2020-11-10 NOTE — Telephone Encounter (Signed)
Patient returned your call and is ok with the date and time provided.

## 2020-11-10 NOTE — Telephone Encounter (Signed)
Left detailed message letting patient know that we have found a date in October. Advised that Dr. Loletha Carrow next available date would be Friday, 01/09/21 at 12:30 pm with an 11 am arrival time. Advised patient to call me back and let me know if this would work for him.

## 2020-11-10 NOTE — Telephone Encounter (Signed)
Patient has been scheduled for his colonoscopy at [facility] on Friday, 1028/22 at 12:30 PM, arriving at 11 am with a care partner. Advised pt to keep his pre-visit appt as scheduled. Advised patient that it is important that he keeps colonoscopy appt as scheduled. Advised patient to give me a call back if he had any questions.

## 2020-11-20 ENCOUNTER — Other Ambulatory Visit: Payer: Self-pay

## 2020-11-20 ENCOUNTER — Ambulatory Visit (AMBULATORY_SURGERY_CENTER): Payer: Self-pay | Admitting: *Deleted

## 2020-11-20 VITALS — Ht 67.5 in | Wt 189.0 lb

## 2020-11-20 DIAGNOSIS — Z86010 Personal history of colonic polyps: Secondary | ICD-10-CM

## 2020-11-20 MED ORDER — PLENVU 140 G PO SOLR: 1.0000 | Freq: Once | ORAL | 0 refills | Status: AC

## 2020-11-20 NOTE — Progress Notes (Signed)
Patient is here in-person for PV. Patient denies any allergies to eggs or soy. Patient denies any problems with anesthesia/sedation. Patient is not on any oxygen use at home. Patient is not taking any diet/weight loss medications or blood thinners. Patient is aware of our care-partner policy and 0000000 safety protocol.   EMMI education assigned to the patient for the procedure, sent to [place].   Patient is COVID-19 vaccinated.  plenvu Prep Prescription coupon was given to the patient. I went over Plenvu prep instructions with the patient x2 no questions from patient.

## 2020-12-04 ENCOUNTER — Ambulatory Visit (INDEPENDENT_AMBULATORY_CARE_PROVIDER_SITE_OTHER): Payer: BC Managed Care – PPO

## 2020-12-04 ENCOUNTER — Encounter: Payer: BC Managed Care – PPO | Admitting: Gastroenterology

## 2020-12-04 ENCOUNTER — Other Ambulatory Visit: Payer: Self-pay

## 2020-12-04 DIAGNOSIS — E538 Deficiency of other specified B group vitamins: Secondary | ICD-10-CM

## 2020-12-04 MED ORDER — CYANOCOBALAMIN 1000 MCG/ML IJ SOLN
1000.0000 ug | Freq: Once | INTRAMUSCULAR | Status: AC
  Administered 2020-12-04: 1000 ug via INTRAMUSCULAR

## 2020-12-04 NOTE — Progress Notes (Signed)
Per orders of Dr. Hernandez, injection of B12 given by Angelena Sand. Patient tolerated injection well.  

## 2020-12-09 ENCOUNTER — Telehealth: Payer: Self-pay

## 2020-12-09 NOTE — Telephone Encounter (Signed)
Spoke with patient, to reschedule his appt at [facility]. Patient states that he actually needs to cancel his procedure because his father passed away last 06/17/22 and he has to focus on dealing with his father estate. I told patient that we were sorry for his loss and completely understand. Pt states that he will call us back next year to have colonoscopy. Advised that I will put recall in the system. Pt had no other concerns at the end of the call.  Colonoscopy at [facility] on 01/09/21 has been cancelled.  4 month recall in epic.

## 2020-12-09 NOTE — Telephone Encounter (Signed)
Dr. Loletha Carrow will not be able to perform colonoscopy at [facility] on 01/09/21. I left a vm on patient's mobile phone asking him to contact the office to discuss a new date for procedure.

## 2020-12-09 NOTE — Telephone Encounter (Signed)
Sorry to hear that news  Thank you for the update, and I agree with the timing of the recall.  HD

## 2020-12-15 ENCOUNTER — Other Ambulatory Visit (INDEPENDENT_AMBULATORY_CARE_PROVIDER_SITE_OTHER): Payer: BC Managed Care – PPO

## 2020-12-15 ENCOUNTER — Other Ambulatory Visit: Payer: Self-pay

## 2020-12-15 DIAGNOSIS — E559 Vitamin D deficiency, unspecified: Secondary | ICD-10-CM

## 2020-12-15 LAB — VITAMIN D 25 HYDROXY (VIT D DEFICIENCY, FRACTURES): VITD: 43.87 ng/mL (ref 30.00–100.00)

## 2021-01-08 ENCOUNTER — Other Ambulatory Visit: Payer: Self-pay

## 2021-01-08 ENCOUNTER — Emergency Department (HOSPITAL_COMMUNITY)
Admission: EM | Admit: 2021-01-08 | Discharge: 2021-01-08 | Disposition: A | Discharge status: Home / Self Care | Payer: BC Managed Care – PPO | Attending: Emergency Medicine | Admitting: Emergency Medicine

## 2021-01-08 ENCOUNTER — Encounter (HOSPITAL_COMMUNITY): Payer: Self-pay | Admitting: Emergency Medicine

## 2021-01-08 DIAGNOSIS — Z79899 Other long term (current) drug therapy: Secondary | ICD-10-CM | POA: Diagnosis not present

## 2021-01-08 DIAGNOSIS — R1110 Vomiting, unspecified: Secondary | ICD-10-CM | POA: Diagnosis not present

## 2021-01-08 DIAGNOSIS — F17210 Nicotine dependence, cigarettes, uncomplicated: Secondary | ICD-10-CM | POA: Insufficient documentation

## 2021-01-08 DIAGNOSIS — F1010 Alcohol abuse, uncomplicated: Secondary | ICD-10-CM

## 2021-01-08 MED ORDER — CHLORDIAZEPOXIDE HCL 25 MG PO CAPS: ORAL_CAPSULE | ORAL | 0 refills | Status: DC

## 2021-01-08 NOTE — ED Provider Notes (Signed)
[facility] EMERGENCY DEPARTMENT Provider Note   CSN: [id number] Arrival date & time: 01/08/21  1036     History Chief Complaint  Patient presents with  . Alcohol Problem    Christian Haley is a 45 y.o. male.  HPI  Patient with history of Asperger syndrome, nicotine dependence, alcohol abuse presents seeking resources for alcohol treatment.  His last drink was 9 PM last night, reports he has been having increasing cravings due to an aggravating factor of his father recently passing in September.  He did have 1 episode of emesis earlier today secondary to drinking 750 mL of vodka yesterday.  Denies any abdominal pain, no confusion or seizing.  He does not have any ideations of suicide or homicide.  Does not have plan for either.  Patient denies having any previous attempts.  Past Medical History:  Diagnosis Date  . Alcohol abuse   . Asperger syndrome   . Depression   . Nicotine dependence     Patient Active Problem List   Diagnosis Date Noted  . Hyperlipidemia 03/25/2020  . Vitamin D deficiency 03/25/2020  . Vitamin B12 deficiency 03/25/2020  . Autism spectrum disorder 11/15/2019  . Depression, recurrent ([place]) 11/15/2019  . Nicotine dependence 11/15/2019  . Anxiety disorder, unspecified 12/03/2016  . Tobacco abuse 07/31/2012  . History of alcoholism ([place]) 07/31/2012    Past Surgical History:  Procedure Laterality Date  . COLONOSCOPY  05/27/2020   Dr.Danis  . TONSILLECTOMY         Family History  Problem Relation Age of Onset  . Pancreatic cancer Father   . CVA Paternal Grandmother   . Lung cancer Paternal Grandfather   . Colon cancer Neg Hx     Social History   Tobacco Use  . Smoking status: Every Day    Packs/day: 0.50    Years: 20.00    Pack years: 10.00    Types: Cigarettes  . Smokeless tobacco: Never  Vaping Use  . Vaping Use: Never used  Substance Use Topics  . Alcohol use: Not Currently  . Drug use: No    Home  Medications Prior to Admission medications   Medication Sig Start Date End Date Taking? Authorizing Provider  atorvastatin (LIPITOR) 40 MG tablet Take 1 tablet (40 mg total) by mouth daily. 09/24/20   Isaac Bliss, Rayford Halsted, MD  PARoxetine (PAXIL) 20 MG tablet Take 20 mg by mouth 2 (two) times daily. 11/03/19   [provider]    Allergies    Patient has no known allergies.  Review of Systems   Review of Systems  Constitutional:  Negative for fever.  Eyes:  Negative for visual disturbance.  Respiratory:  Negative for shortness of breath.   Cardiovascular:  Negative for chest pain.  Gastrointestinal:  Positive for vomiting. Negative for abdominal pain and nausea.  Musculoskeletal:  Negative for back pain.  Neurological:  Negative for seizures.  Psychiatric/Behavioral:  Negative for confusion, hallucinations and suicidal ideas.    Physical Exam Updated Vital Signs BP 128/83 (BP Location: Left Arm)   Pulse (!) 102   Temp 98.2 F (36.8 C) (Oral)   Resp 18   SpO2 100%   Physical Exam Vitals and nursing note reviewed. Exam conducted with a chaperone present.  Constitutional:      General: He is not in acute distress.    Appearance: Normal appearance.     Comments: Well-appearing  HENT:     Head: Normocephalic and atraumatic.  Eyes:  General: No scleral icterus.    Extraocular Movements: Extraocular movements intact.     Pupils: Pupils are equal, round, and reactive to light.  Abdominal:     General: Abdomen is flat.     Palpations: Abdomen is soft.     Tenderness: There is no abdominal tenderness.     Comments: Abdomen soft and nontender  Skin:    Coloration: Skin is not jaundiced.  Neurological:     Mental Status: He is alert. Mental status is at baseline.     Coordination: Coordination normal.   ED Results / Procedures / Treatments   Labs (all labs ordered are listed, but only abnormal results are displayed) Labs Reviewed - No data to  display  EKG None  Radiology No results found.  Procedures Procedures   Medications Ordered in ED Medications - No data to display  ED Course  I have reviewed the triage vital signs and the nursing notes.  Pertinent labs & imaging results that were available during my care of the patient were reviewed by me and considered in my medical decision making (see chart for details).    MDM Rules/Calculators/A&P                           Patient with documented history of alcohol use seeking treatment.  He is not having any signs of withdrawal.  His vitals are stable although he is mildly tachycardic.  Does not have any clinical signs of intoxication, patient is stable and well-appearing.  Discussed of resources, will provide the patient with a list.  I also discussed starting the patient on Librium to which he agrees to proceed.  Do not see reason for additional work-up at this time, discharged patient in stable condition.  Final Clinical Impression(s) / ED Diagnoses Final diagnoses:  None    Rx / DC Orders ED Discharge Orders     None        Sherrill Raring, PA-C 01/08/21 1229    Sherwood Gambler, MD 01/08/21 1450

## 2021-01-08 NOTE — ED Triage Notes (Signed)
Patient here with complaint of increased desire to consume alcohol daily after his father died in 12/04/2022. Patient reports he drinks up to 762mL liquor daily, last drink yesterday. Here requesting help getting help managing his alcoholism. Patient denies SI/HI. Denies AVH, denies headache, minimal diaphoresis, no confusion, denies nausea.

## 2021-01-08 NOTE — Discharge Instructions (Addendum)
Take Librium 50 mg 3 times daily for 1 day.  After that take 25 to 50 mg twice daily for 1 day.  After that you can take 25 to 50 mg by mouth once daily.  Here is information regarding substance abuse.  Please call to set up follow-up.  I have attached some information on finding treatment and about the medication I am starting you on.  Please follow-up with a primary care doctor as needed.

## 2021-01-08 NOTE — ED Notes (Signed)
Pt came in for excess alcohol use, pt is agreeable to discharge he has an rx for librium, he is calm and compliant at this time, denies pain, he is a/ox4, and ambulatory.

## 2021-01-09 ENCOUNTER — Ambulatory Visit (HOSPITAL_COMMUNITY): Admit: 2021-01-09 | Payer: BC Managed Care – PPO | Admitting: Gastroenterology

## 2021-01-09 ENCOUNTER — Encounter (HOSPITAL_COMMUNITY): Payer: Self-pay

## 2021-01-09 SURGERY — COLONOSCOPY WITH PROPOFOL
Anesthesia: Monitor Anesthesia Care

## 2021-01-26 DIAGNOSIS — F840 Autistic disorder: Secondary | ICD-10-CM | POA: Diagnosis not present

## 2021-03-22 ENCOUNTER — Encounter: Payer: Self-pay | Admitting: Internal Medicine

## 2021-03-26 ENCOUNTER — Encounter: Payer: BC Managed Care – PPO | Admitting: Internal Medicine

## 2021-04-23 ENCOUNTER — Encounter: Payer: Self-pay | Admitting: Gastroenterology

## 2021-04-27 DIAGNOSIS — F1010 Alcohol abuse, uncomplicated: Secondary | ICD-10-CM | POA: Diagnosis not present

## 2021-04-27 DIAGNOSIS — F419 Anxiety disorder, unspecified: Secondary | ICD-10-CM | POA: Diagnosis not present

## 2021-04-27 DIAGNOSIS — F840 Autistic disorder: Secondary | ICD-10-CM | POA: Diagnosis not present

## 2021-04-29 ENCOUNTER — Ambulatory Visit (INDEPENDENT_AMBULATORY_CARE_PROVIDER_SITE_OTHER): Payer: BC Managed Care – PPO | Admitting: Internal Medicine

## 2021-04-29 ENCOUNTER — Encounter: Payer: Self-pay | Admitting: Internal Medicine

## 2021-04-29 VITALS — BP 110/80 | HR 88 | Temp 98.1°F | Ht 67.5 in | Wt 183.1 lb

## 2021-04-29 DIAGNOSIS — F17210 Nicotine dependence, cigarettes, uncomplicated: Secondary | ICD-10-CM | POA: Diagnosis not present

## 2021-04-29 DIAGNOSIS — H6121 Impacted cerumen, right ear: Secondary | ICD-10-CM | POA: Diagnosis not present

## 2021-04-29 DIAGNOSIS — E559 Vitamin D deficiency, unspecified: Secondary | ICD-10-CM | POA: Diagnosis not present

## 2021-04-29 DIAGNOSIS — E538 Deficiency of other specified B group vitamins: Secondary | ICD-10-CM

## 2021-04-29 DIAGNOSIS — E782 Mixed hyperlipidemia: Secondary | ICD-10-CM

## 2021-04-29 DIAGNOSIS — R933 Abnormal findings on diagnostic imaging of other parts of digestive tract: Secondary | ICD-10-CM

## 2021-04-29 DIAGNOSIS — Z0000 Encounter for general adult medical examination without abnormal findings: Secondary | ICD-10-CM

## 2021-04-29 DIAGNOSIS — F840 Autistic disorder: Secondary | ICD-10-CM

## 2021-04-29 DIAGNOSIS — Z125 Encounter for screening for malignant neoplasm of prostate: Secondary | ICD-10-CM

## 2021-04-29 LAB — CBC WITH DIFFERENTIAL/PLATELET
Basophils Absolute: 0.1 10*3/uL (ref 0.0–0.1)
Basophils Relative: 1 % (ref 0.0–3.0)
Eosinophils Absolute: 0.3 10*3/uL (ref 0.0–0.7)
Eosinophils Relative: 3.2 % (ref 0.0–5.0)
HCT: 44.6 % (ref 39.0–52.0)
Hemoglobin: 14.7 g/dL (ref 13.0–17.0)
Lymphocytes Relative: 29.5 % (ref 12.0–46.0)
Lymphs Abs: 2.4 10*3/uL (ref 0.7–4.0)
MCHC: 33 g/dL (ref 30.0–36.0)
MCV: 91.1 fl (ref 78.0–100.0)
Monocytes Absolute: 0.7 10*3/uL (ref 0.1–1.0)
Monocytes Relative: 8.6 % (ref 3.0–12.0)
Neutro Abs: 4.6 10*3/uL (ref 1.4–7.7)
Neutrophils Relative %: 57.7 % (ref 43.0–77.0)
Platelets: 371 10*3/uL (ref 150.0–400.0)
RBC: 4.9 Mil/uL (ref 4.22–5.81)
RDW: 13.5 % (ref 11.5–15.5)
WBC: 8 10*3/uL (ref 4.0–10.5)

## 2021-04-29 LAB — COMPREHENSIVE METABOLIC PANEL
ALT: 10 U/L (ref 0–53)
AST: 14 U/L (ref 0–37)
Albumin: 4.7 g/dL (ref 3.5–5.2)
Alkaline Phosphatase: 61 U/L (ref 39–117)
BUN: 11 mg/dL (ref 6–23)
CO2: 25 mEq/L (ref 19–32)
Calcium: 9.9 mg/dL (ref 8.4–10.5)
Chloride: 102 mEq/L (ref 96–112)
Creatinine, Ser: 0.86 mg/dL (ref 0.40–1.50)
GFR: 103 mL/min (ref >60.00)
Glucose, Bld: 73 mg/dL (ref 70–99)
Potassium: 4.4 mEq/L (ref 3.5–5.1)
Sodium: 140 mEq/L (ref 135–145)
Total Bilirubin: 0.6 mg/dL (ref 0.2–1.2)
Total Protein: 7.7 g/dL (ref 6.0–8.3)

## 2021-04-29 LAB — HEMOGLOBIN A1C: Hgb A1c MFr Bld: 5.2 % (ref 4.6–6.5)

## 2021-04-29 LAB — LIPID PANEL
Cholesterol: 230 mg/dL — ABNORMAL HIGH (ref 0–200)
HDL: 54 mg/dL (ref >39.00)
LDL Cholesterol: 159 mg/dL — ABNORMAL HIGH (ref 0–99)
NonHDL: 176.16
Total CHOL/HDL Ratio: 4
Triglycerides: 84 mg/dL (ref 0.0–149.0)
VLDL: 16.8 mg/dL (ref 0.0–40.0)

## 2021-04-29 LAB — PSA: PSA: 3.15 ng/mL (ref 0.10–4.00)

## 2021-04-29 LAB — VITAMIN D 25 HYDROXY (VIT D DEFICIENCY, FRACTURES): VITD: 47.4 ng/mL (ref 30.00–100.00)

## 2021-04-29 LAB — VITAMIN B12: Vitamin B-12: 225 pg/mL (ref 211–911)

## 2021-04-29 MED ORDER — ATORVASTATIN CALCIUM 40 MG PO TABS: 40.0000 mg | ORAL_TABLET | Freq: Every day | ORAL | 1 refills | Status: DC

## 2021-04-29 NOTE — Patient Instructions (Signed)
-  Nice seeing you today!!  -Lab work today; will notify you once results are available.  -Make sure you schedule an eye and dental exams.  Make sure you follow up with GI for repeat colonoscopy.  -See you back in 6 months.

## 2021-04-29 NOTE — Progress Notes (Signed)
Established Patient Office Visit     This visit occurred during the SARS-CoV-2 public health emergency.  Safety protocols were in place, including screening questions prior to the visit, additional usage of staff PPE, and extensive cleaning of exam room while observing appropriate contact time as indicated for disinfecting solutions.    CC/Reason for Visit: Annual preventive exam  HPI: Christian Haley is a 46 y.o. male who is coming in today for the above mentioned reasons. Past Medical History is significant for: Depression, autism spectrum disorder, hyperlipidemia, vitamin D and B12 deficiencies, prior history of alcohol abuse as well as ongoing nicotine dependence.  He quit taking his Lipitor when it ran out as he was not clear if it was a temporary or permanent prescription.  He is overdue for an eye exam.  He has been having difficulty hearing out of his right ear.  He had a colonoscopy in March 2022 by Dr. Loletha Carrow with incomplete polyp resection and was advised 35-month follow-up which he never scheduled as his father passed away unexpectedly last 12-27-22.  He has no acute concerns or complaints today.   Past Medical/Surgical History: Past Medical History:  Diagnosis Date   Alcohol abuse    Asperger syndrome    Depression    Nicotine dependence     Past Surgical History:  Procedure Laterality Date   COLONOSCOPY  05/27/2020   Dr.Danis   TONSILLECTOMY      Social History:  reports that he has been smoking cigarettes. He has a 10.00 pack-year smoking history. He has never used smokeless tobacco. He reports that he does not currently use alcohol. He reports that he does not use drugs.  Allergies: No Known Allergies  Family History:  Family History  Problem Relation Age of Onset   Pancreatic cancer Father    CVA Paternal Grandmother    Lung cancer Paternal Grandfather    Colon cancer Neg Hx      Current Outpatient Medications:    PARoxetine (PAXIL) 20 MG  tablet, Take 20 mg by mouth 2 (two) times daily., Disp: , Rfl:    atorvastatin (LIPITOR) 40 MG tablet, Take 1 tablet (40 mg total) by mouth daily., Disp: 90 tablet, Rfl: 1   chlordiazePOXIDE (LIBRIUM) 25 MG capsule, 50mg  PO TID x 1D, then 25-50mg  PO BID X 1D, then 25-50mg  PO QD X 1D (Patient not taking: Reported on 04/29/2021), Disp: 10 capsule, Rfl: 0  Review of Systems:  Constitutional: Denies fever, chills, diaphoresis, appetite change and fatigue.  HEENT: Denies photophobia, eye pain, redness,  ear pain, congestion, sore throat, rhinorrhea, sneezing, mouth sores, trouble swallowing, neck pain, neck stiffness and tinnitus.   Respiratory: Denies SOB, DOE, cough, chest tightness,  and wheezing.   Cardiovascular: Denies chest pain, palpitations and leg swelling.  Gastrointestinal: Denies nausea, vomiting, abdominal pain, diarrhea, constipation, blood in stool and abdominal distention.  Genitourinary: Denies dysuria, urgency, frequency, hematuria, flank pain and difficulty urinating.  Endocrine: Denies: hot or cold intolerance, sweats, changes in hair or nails, polyuria, polydipsia. Musculoskeletal: Denies myalgias, back pain, joint swelling, arthralgias and gait problem.  Skin: Denies pallor, rash and wound.  Neurological: Denies dizziness, seizures, syncope, weakness, light-headedness, numbness and headaches.  Hematological: Denies adenopathy. Easy bruising, personal or family bleeding history  Psychiatric/Behavioral: Denies suicidal ideation, mood changes, confusion, nervousness, sleep disturbance and agitation    Physical Exam: Vitals:   04/29/21 0903  BP: 110/80  Pulse: 88  Temp: 98.1 F (36.7 C)  TempSrc: Oral  SpO2: 99%  Weight: 183 lb 1.6 oz (83.1 kg)  Height: 5' 7.5" (1.715 m)    Body mass index is 28.25 kg/m.   Constitutional: NAD, calm, comfortable Eyes: PERRL, lids and conjunctivae normal ENMT: Mucous membranes are moist. Posterior pharynx clear of any exudate or  lesions. Normal dentition. Tympanic membrane is pearly white, no erythema or bulging on the left, right is obstructed by cerumen. Neck: normal, supple, no masses, no thyromegaly Respiratory: clear to auscultation bilaterally, no wheezing, no crackles. Normal respiratory effort. No accessory muscle use.  Cardiovascular: Regular rate and rhythm, no murmurs / rubs / gallops. No extremity edema. 2+ pedal pulses. No carotid bruits.  Abdomen: no tenderness, no masses palpated. No hepatosplenomegaly. Bowel sounds positive.  Musculoskeletal: no clubbing / cyanosis. No joint deformity upper and lower extremities. Good ROM, no contractures. Normal muscle tone.  Skin: no rashes, lesions, ulcers. No induration Neurologic: CN 2-12 grossly intact. Sensation intact, DTR normal. Strength 5/5 in all 4.  Psychiatric:  Alert and oriented x 3. Normal mood.    Impression and Plan:  Encounter for preventive health examination  -Recommend routine eye and dental care. -Immunizations: All immunizations are up-to-date and age-appropriate -Healthy lifestyle discussed in detail. -Labs to be updated today. -Colon cancer screening: 05/2020, 2-month follow-up was recommended due to incomplete polypectomy, will send back to GI. -Breast cancer screening: Not applicable -Cervical cancer screening: Not applicable -Lung cancer screening: Not applicable -Prostate cancer screening: PSA today -DEXA: Not applicable  Mixed hyperlipidemia  - Plan: CBC with Differential/Platelet, Comprehensive metabolic panel, Lipid panel, Hemoglobin A1c, atorvastatin (LIPITOR) 40 MG tablet -Resume atorvastatin, check lipids today.  Vitamin D deficiency  - Plan: VITAMIN D 25 Hydroxy (Vit-D Deficiency, Fractures)  Vitamin B12 deficiency  - Plan: Vitamin B12  Cigarette nicotine dependence without complication -Currently smoking around 10 cigarettes a day, not interested in discussing smoking cessation.  Autism spectrum  disorder -Noted  Right ear impacted cerumen -Cerumen Desimpaction  After patient consent was obtained, warm water was applied and gentle ear lavage performed on right ear. There were no complications and following the desimpaction the tympanic membranes were visible. Tympanic membranes are intact following the procedure. Auditory canals are normal. The patient reported relief of symptoms after removal of cerumen.     Patient Instructions  -Nice seeing you today!!  -Lab work today; will notify you once results are available.  -Make sure you schedule an eye and dental exams.  Make sure you follow up with GI for repeat colonoscopy.  -See you back in 6 months.      Lelon Frohlich, MD [facility]

## 2021-05-20 DIAGNOSIS — F840 Autistic disorder: Secondary | ICD-10-CM | POA: Diagnosis not present

## 2021-06-01 DIAGNOSIS — F331 Major depressive disorder, recurrent, moderate: Secondary | ICD-10-CM | POA: Diagnosis not present

## 2021-06-05 ENCOUNTER — Telehealth: Payer: Self-pay | Admitting: Gastroenterology

## 2021-06-05 ENCOUNTER — Encounter: Payer: Self-pay | Admitting: Gastroenterology

## 2021-06-05 ENCOUNTER — Ambulatory Visit: Payer: BC Managed Care – PPO | Admitting: Gastroenterology

## 2021-06-05 VITALS — BP 116/80 | HR 102 | Ht 67.0 in | Wt 185.4 lb

## 2021-06-05 DIAGNOSIS — Z86010 Personal history of colonic polyps: Secondary | ICD-10-CM

## 2021-06-05 MED ORDER — PLENVU 140 G PO SOLR: 1.0000 | ORAL | 0 refills | Status: DC

## 2021-06-05 NOTE — Progress Notes (Addendum)
? ? ? ?Christian Haley GI Progress Note ? ?Chief Complaint: colon polyp ? ?Subjective  ?History: ?Colon 05/27/20 - large TC sessile polyp, poor lift, incomplete resection. Lost IV access, patient agitation, colon spasm. ?Christian Haley is here to follow-up and plan repeat colonoscopy for EMR of remaining polyp tissue in the transverse colon.  Christian Haley denies abdominal pain altered bowel habits or rectal bleeding. ?Christian Haley is grieving the loss of Christian Haley father from pancreatic cancer about 6 months ago but says Christian Haley is doing better day by day.  Christian Haley recently established care with a new therapist and has a good relationship with them. ?Christian Haley health has been good since I last saw him. ? ?ROS: ?Cardiovascular:  no chest pain ?Respiratory: no dyspnea ? ?The patient's Past Medical, Family and Social History were reviewed and are on file in the EMR. ? ?Objective: ? ?Med list reviewed ? ?Current Outpatient Medications:  ?  atorvastatin (LIPITOR) 40 MG tablet, Take 1 tablet (40 mg total) by mouth daily., Disp: 90 tablet, Rfl: 1 ?  PARoxetine (PAXIL) 20 MG tablet, Take 20 mg by mouth 2 (two) times daily., Disp: , Rfl:  ?  PEG-KCl-NaCl-NaSulf-Na Asc-C (PLENVU) 140 g SOLR, Take 1 kit by mouth as directed. Use coupon: BIN: [id number] PNC: CNRX Group: [id number] ID: [id number], Disp: 1 each, Rfl: 0 ? ?Past Medical History:  ?Diagnosis Date  ? Alcohol abuse   ? Asperger syndrome   ? Depression   ? Hyperlipidemia   ? Nicotine dependence   ? ?Christian Haley continues to smoke about 10 cigarettes a day ? ?Christian Haley lives alone and has frequent contact with an aunt and uncle who live in this area, seeing them on average twice a week. ? ?Vital signs in last 24 hrs: ?Vitals:  ? 06/05/21 1408  ?BP: 116/80  ?Pulse: (!) 102  ?SpO2: 98%  ? ?Wt Readings from Last 3 Encounters:  ?06/05/21 185 lb 6.4 oz (84.1 kg)  ?04/29/21 183 lb 1.6 oz (83.1 kg)  ?01/08/21 180 lb (81.6 kg)  ?  ?Physical Exam ? ?Christian Haley is well-appearing ?HEENT: sclera anicteric, oral mucosa moist without lesions.  Poor dentition,  nothing loose cracked or broken ?Neck: supple, no thyromegaly, JVD or lymphadenopathy.  Normal mobility ?Cardiac: RRR without murmurs, S1S2 heard, no peripheral edema ?Pulm: clear to auscultation bilaterally, normal RR and effort noted ?Abdomen: soft, no tenderness, with active bowel sounds. No guarding or palpable hepatosplenomegaly. ? ?Labs: ? ? ?___________________________________________ ?Radiologic studies: ? ? ?____________________________________________ ?Other: ?1. Surgical [P], colon, transverse, polyp (1) ?- SESSILE SERRATED POLYP WITHOUT CYTOLOGIC DYSPLASIA. ?2. Surgical [P], colon, descending, polyp (1) ?- HYPERPLASTIC POLYP. ?3. Surgical [P], colon, sigmoid, polyp (1) ?- TUBULAR ADENOMA WITHOUT HIGH GRADE DYSPLASIA. ? ?_____________________________________________ ?Assessment & Plan  ?Assessment: ?Encounter Diagnosis  ?Name Primary?  ? History of colonic polyps Yes  ? ?Ariston had multiple polyps on Christian Haley first colonoscopy at age 46, including an advanced serrated polyp in the transverse.  It was incompletely removed due to difficulty with adequate lift as well as loss of IV access and patient agitation.  Christian Haley also had a fair prep in the left colon ? ? ?Plan: ?Colonoscopy in the [facility] endoscopy lab for EMR of remaining polyp.  Christian Haley was agreeable after discussion of procedure and risks. ? The benefits and risks of the planned procedure were described in detail with the patient or (when appropriate) their health care proxy.  Risks were outlined as including, but not limited to, bleeding, infection, perforation, adverse medication reaction leading to cardiac or pulmonary  decompensation, pancreatitis (if ERCP).  The limitation of incomplete mucosal visualization was also discussed.  No guarantees or warranties were given. ? ?Treyshon tells me Christian Haley had already discussed it with Christian Haley uncle, who was agreeable to bringing him for the procedure.  Christian Haley will be instructed to consume more than the usual recommended amount of  water with the bowel preparation for better quality visualization on the upcoming exam. ? ?22 minutes were spent on this encounter (including chart review, history/exam, counseling/coordination of care, and documentation) > 50% of that time was spent on counseling and coordination of care.  ? ?Nelida Meuse III ? ?

## 2021-06-05 NOTE — Patient Instructions (Signed)
You have been scheduled for a colonoscopy. Please follow written instructions given to you at your visit today.  ?Please pick up your prep supplies at the pharmacy within the next 1-3 days. ?If you use inhalers (even only as needed), please bring them with you on the day of your procedure. ? ?If you are age 48 or older, your body mass index should be between 23-30. Your Body mass index is 29.04 kg/m?Marland Kitchen If this is out of the aforementioned range listed, please consider follow up with your Primary Care Provider. ? ?If you are age 58 or younger, your body mass index should be between 19-25. Your Body mass index is 29.04 kg/m?Marland Kitchen If this is out of the aformentioned range listed, please consider follow up with your Primary Care Provider.  ? ?________________________________________________________ ? ?The [facility] providers would like to encourage you to use [facility] to communicate with providers for non-urgent requests or questions.  Due to long hold times on the telephone, sending your provider a message by [facility] may be a faster and more efficient way to get a response.  Please allow 48 business hours for a response.  Please remember that this is for non-urgent requests.  ?_______________________________________________________ ? ?Due to recent changes in healthcare laws, you may see the results of your imaging and laboratory studies on MyChart before your provider has had a chance to review them.  We understand that in some cases there may be results that are confusing or concerning to you. Not all laboratory results come back in the same time frame and the provider may be waiting for multiple results in order to interpret others.  Please give [place] 48 hours in order for your provider to thoroughly review all the results before contacting the office for clarification of your results.  ? ?It was a pleasure to see you today! ? ?Thank you for trusting me with your gastrointestinal care!   ? ? ?

## 2021-06-05 NOTE — Telephone Encounter (Signed)
Patient called to let you know that his uncle, Olander Friedl, [phone], would be with him at his procedure in May. ?

## 2021-06-05 NOTE — Telephone Encounter (Signed)
Noted  

## 2021-06-15 DIAGNOSIS — F331 Major depressive disorder, recurrent, moderate: Secondary | ICD-10-CM | POA: Diagnosis not present

## 2021-07-06 ENCOUNTER — Encounter (HOSPITAL_COMMUNITY): Payer: Self-pay | Admitting: Gastroenterology

## 2021-07-06 NOTE — Progress Notes (Signed)
Attempted to obtain medical history via telephone, unable to reach at this time. I left a voicemail to return pre surgical testing department's phone call.  

## 2021-07-13 ENCOUNTER — Encounter (HOSPITAL_COMMUNITY): Payer: Self-pay | Admitting: Gastroenterology

## 2021-07-13 ENCOUNTER — Other Ambulatory Visit: Payer: Self-pay

## 2021-07-13 ENCOUNTER — Ambulatory Visit (HOSPITAL_COMMUNITY): Payer: BC Managed Care – PPO | Admitting: Anesthesiology

## 2021-07-13 ENCOUNTER — Ambulatory Visit (HOSPITAL_COMMUNITY)
Admission: RE | Admit: 2021-07-13 | Discharge: 2021-07-13 | Disposition: A | Discharge status: Home / Self Care | Payer: BC Managed Care – PPO | Attending: Gastroenterology | Admitting: Gastroenterology

## 2021-07-13 ENCOUNTER — Encounter (HOSPITAL_COMMUNITY)
Admission: RE | Disposition: A | Discharge status: Home / Self Care | Payer: Self-pay | Source: Home / Self Care | Attending: Gastroenterology

## 2021-07-13 DIAGNOSIS — F418 Other specified anxiety disorders: Secondary | ICD-10-CM | POA: Diagnosis not present

## 2021-07-13 DIAGNOSIS — Z1211 Encounter for screening for malignant neoplasm of colon: Secondary | ICD-10-CM | POA: Insufficient documentation

## 2021-07-13 DIAGNOSIS — K5730 Diverticulosis of large intestine without perforation or abscess without bleeding: Secondary | ICD-10-CM | POA: Insufficient documentation

## 2021-07-13 DIAGNOSIS — K635 Polyp of colon: Secondary | ICD-10-CM | POA: Diagnosis not present

## 2021-07-13 DIAGNOSIS — F17200 Nicotine dependence, unspecified, uncomplicated: Secondary | ICD-10-CM | POA: Diagnosis not present

## 2021-07-13 DIAGNOSIS — D123 Benign neoplasm of transverse colon: Secondary | ICD-10-CM | POA: Diagnosis not present

## 2021-07-13 DIAGNOSIS — K648 Other hemorrhoids: Secondary | ICD-10-CM | POA: Diagnosis not present

## 2021-07-13 DIAGNOSIS — Z86010 Personal history of colonic polyps: Secondary | ICD-10-CM | POA: Insufficient documentation

## 2021-07-13 HISTORY — PX: COLONOSCOPY WITH PROPOFOL: SHX5780

## 2021-07-13 HISTORY — PX: POLYPECTOMY: SHX5525

## 2021-07-13 SURGERY — COLONOSCOPY WITH PROPOFOL
Anesthesia: Monitor Anesthesia Care

## 2021-07-13 MED ORDER — PROPOFOL 500 MG/50ML IV EMUL
INTRAVENOUS | Status: AC
  Filled 2021-07-13: qty 50

## 2021-07-13 MED ORDER — LIDOCAINE 2% (20 MG/ML) 5 ML SYRINGE
INTRAMUSCULAR | Status: DC | PRN
  Administered 2021-07-13: 60 mg via INTRAVENOUS

## 2021-07-13 MED ORDER — PROPOFOL 500 MG/50ML IV EMUL
INTRAVENOUS | Status: DC | PRN
  Administered 2021-07-13: 125 ug/kg/min via INTRAVENOUS

## 2021-07-13 MED ORDER — LACTATED RINGERS IV SOLN
INTRAVENOUS | Status: AC | PRN
Start: 2021-07-13 — End: 2021-07-13
  Administered 2021-07-13: 10 mL/h via INTRAVENOUS

## 2021-07-13 MED ORDER — PROPOFOL 10 MG/ML IV BOLUS
INTRAVENOUS | Status: DC | PRN
  Administered 2021-07-13: 30 mg via INTRAVENOUS
  Administered 2021-07-13 (×2): 20 mg via INTRAVENOUS

## 2021-07-13 MED ORDER — SODIUM CHLORIDE 0.9 % IV SOLN: INTRAVENOUS | Status: DC

## 2021-07-13 MED ORDER — PROPOFOL 10 MG/ML IV BOLUS
INTRAVENOUS | Status: AC
  Filled 2021-07-13: qty 20

## 2021-07-13 SURGICAL SUPPLY — 22 items

## 2021-07-13 NOTE — Anesthesia Postprocedure Evaluation (Signed)
Anesthesia Post Note ? ?Patient: Christian Haley ? ?Procedure(s) Performed: COLONOSCOPY WITH PROPOFOL ?POLYPECTOMY ? ?  ? ?Patient location during evaluation: PACU ?Anesthesia Type: MAC ?Level of consciousness: awake and alert ?Pain management: pain level controlled ?Vital Signs Assessment: post-procedure vital signs reviewed and stable ?Respiratory status: spontaneous breathing, nonlabored ventilation and respiratory function stable ?Cardiovascular status: blood pressure returned to baseline and stable ?Postop Assessment: no apparent nausea or vomiting ?Anesthetic complications: no ? ? ?No notable events documented. ? ?Last Vitals:  ?Vitals:  ? 07/13/21 1010 07/13/21 1030  ?BP: 114/77 108/85  ?Pulse: 69 68  ?Resp: 13 (!) 23  ?Temp:    ?SpO2:    ?  ?Last Pain:  ?Vitals:  ? 07/13/21 0958  ?TempSrc: Temporal  ?PainSc: 0-No pain  ? ? ?  ?  ?  ?  ?  ?  ? ?Lynda Rainwater ? ? ? ? ?

## 2021-07-13 NOTE — Transfer of Care (Signed)
Immediate Anesthesia Transfer of Care Note ? ?Patient: Christian Haley ? ?Procedure(s) Performed: COLONOSCOPY WITH PROPOFOL ?POLYPECTOMY ? ?Patient Location: PACU and Endoscopy Unit ? ?Anesthesia Type:MAC ? ?Level of Consciousness: awake and patient cooperative ? ?Airway & Oxygen Therapy: Patient Spontanous Breathing and Patient connected to face mask oxygen ? ?Post-op Assessment: Report given to RN, Post -op Vital signs reviewed and stable and VSS - pulse ox 99%  ? ?Post vital signs: Reviewed and stable ? ?Last Vitals:  ?Vitals Value Taken Time  ?BP    ?Temp    ?Pulse    ?Resp    ?SpO2    ? ? ?Last Pain:  ?Vitals:  ? 07/13/21 0809  ?PainSc: 0-No pain  ?   ? ?  ? ?Complications: No notable events documented. ?

## 2021-07-13 NOTE — Anesthesia Preprocedure Evaluation (Signed)
Anesthesia Evaluation  ?Patient identified by MRN, date of birth, ID band ?Patient awake ? ? ? ?Reviewed: ?Allergy & Precautions, NPO status , Patient's Chart, lab work & pertinent test results ? ?Airway ?Mallampati: II ? ?TM Distance: >3 FB ?Neck ROM: Full ? ? ? Dental ?no notable dental hx. ? ?  ?Pulmonary ?neg pulmonary ROS, Current Smoker and Patient abstained from smoking.,  ?  ?Pulmonary exam normal ?breath sounds clear to auscultation ? ? ? ? ? ? Cardiovascular ?negative cardio ROS ?Normal cardiovascular exam ?Rhythm:Regular Rate:Normal ? ? ?  ?Neuro/Psych ?Anxiety Depression negative neurological ROS ? negative psych ROS  ? GI/Hepatic ?negative GI ROS, Neg liver ROS,   ?Endo/Other  ?negative endocrine ROS ? Renal/GU ?negative Renal ROS  ?negative genitourinary ?  ?Musculoskeletal ?negative musculoskeletal ROS ?(+)  ? Abdominal ?  ?Peds ?negative pediatric ROS ?(+)  Hematology ?negative hematology ROS ?(+)   ?Anesthesia Other Findings ? ? Reproductive/Obstetrics ?negative OB ROS ? ?  ? ? ? ? ? ? ? ? ? ? ? ? ? ?  ?  ? ? ? ? ? ? ? ? ?Anesthesia Physical ?Anesthesia Plan ? ?ASA: 2 ? ?Anesthesia Plan: MAC  ? ?Post-op Pain Management: Minimal or no pain anticipated  ? ?Induction: Intravenous ? ?PONV Risk Score and Plan: 0 and Treatment may vary due to age or medical condition ? ?Airway Management Planned: Simple Face Mask ? ?Additional Equipment:  ? ?Intra-op Plan:  ? ?Post-operative Plan:  ? ?Informed Consent: I have reviewed the patients History and Physical, chart, labs and discussed the procedure including the risks, benefits and alternatives for the proposed anesthesia with the patient or authorized representative who has indicated his/her understanding and acceptance.  ? ? ? ?Dental advisory given ? ?Plan Discussed with: CRNA ? ?Anesthesia Plan Comments:   ? ? ? ? ? ? ?Anesthesia Quick Evaluation ? ?

## 2021-07-13 NOTE — Op Note (Signed)
[facility] ?Patient Name: Christian Haley ?Procedure Date: 07/13/2021 ?MRN: [id number] ?Attending MD: Estill Cotta. Loletha Carrow , MD ?Date of Birth: November 25, 1975 ?CSN: [id number] ?Age: 46 ?Admit Type: Outpatient ?Procedure:                Colonoscopy ?Indications:              Increased risk colon cancer surveillance: Personal  ?                          history of colonic polyps ?                          multiple TA and SSP polyps with incomplete EMR of  ?                          TC SSP March 2022 ?Providers:                Estill Cotta. Loletha Carrow, MD, Kary Kos RN, RN, Jacquelin Hawking  ?                          Houle, Technician ?Referring MD:             Rayford Halsted. Isaac Bliss ?Medicines:                Monitored Anesthesia Care ?Complications:            No immediate complications. ?Estimated Blood Loss:     Estimated blood loss was minimal. ?Procedure:                Pre-Anesthesia Assessment: ?                          - Prior to the procedure, a History and Physical  ?                          was performed, and patient medications and  ?                          allergies were reviewed. The patient's tolerance of  ?                          previous anesthesia was also reviewed. The risks  ?                          and benefits of the procedure and the sedation  ?                          options and risks were discussed with the patient.  ?                          All questions were answered, and informed consent  ?                          was obtained. Prior Anticoagulants: The patient has  ?  taken no previous anticoagulant or antiplatelet  ?                          agents. ASA Grade Assessment: II - A patient with  ?                          mild systemic disease. After reviewing the risks  ?                          and benefits, the patient was deemed in  ?                          satisfactory condition to undergo the procedure. ?                          After obtaining informed  consent, the colonoscope  ?                          was passed under direct vision. Throughout the  ?                          procedure, the patient's blood pressure, pulse, and  ?                          oxygen saturations were monitored continuously. The  ?                          CF-HQ190L ([id number]) Olympus colonoscope was  ?                          introduced through the anus and advanced to the the  ?                          cecum, identified by appendiceal orifice and  ?                          ileocecal valve. The colonoscopy was somewhat  ?                          difficult due to significant looping. Successful  ?                          completion of the procedure was aided by using  ?                          manual pressure and straightening and shortening  ?                          the scope to obtain bowel loop reduction. The  ?                          patient tolerated the procedure well. The quality  ?  of the bowel preparation was excellent. The  ?                          ileocecal valve, appendiceal orifice, and rectum  ?                          were photographed. ?Scope In: 9:23:07 AM ?Scope Out: 9:48:21 AM ?Scope Withdrawal Time: 0 hours 21 minutes 2 seconds  ?Total Procedure Duration: 0 hours 25 minutes 14 seconds  ?Findings: ?     The perianal and digital rectal examinations were normal. ?     Repeat examination of right colon under NBI performed. ?     Multiple diverticula were found in the left colon and right colon. ?     A 15 mm polyp was found in the proximal transverse colon. The polyp was  ?     semi-sessile. The polyp was removed with a piecemeal technique using a  ?     cold snare. Resection and retrieval were complete. (adjacent tattoo) ?     Internal hemorrhoids were found. The hemorrhoids were small. ?     The exam was otherwise without abnormality on direct and retroflexion  ?     views. ?Impression:               - Diverticulosis in the left  colon and in the right  ?                          colon. ?                          - One 15 mm polyp in the proximal transverse colon,  ?                          removed piecemeal using a cold snare. Resected and  ?                          retrieved. ?                          - Internal hemorrhoids. ?                          - The examination was otherwise normal on direct  ?                          and retroflexion views. ?Moderate Sedation: ?     MAC sedation used ?Recommendation:           - Patient has a contact number available for  ?                          emergencies. The signs and symptoms of potential  ?                          delayed complications were discussed with the  ?                          patient. Return to normal activities  tomorrow.  ?                          Written discharge instructions were provided to the  ?                          patient. ?                          - Resume previous diet. ?                          - Continue present medications. ?                          - Await pathology results. ?                          - Repeat colonoscopy in 3 years for surveillance. ?Procedure Code(s):        --- Professional --- ?                          [id number], Colonoscopy, flexible; with removal of  ?                          tumor(s), polyp(s), or other lesion(s) by snare  ?                          technique ?Diagnosis Code(s):        --- Professional --- ?                          Z86.010, Personal history of colonic polyps ?                          K64.8, Other hemorrhoids ?                          K63.5, Polyp of colon ?                          K57.30, Diverticulosis of large intestine without  ?                          perforation or abscess without bleeding ?CPT copyright 2019 [employer]. All rights reserved. ?The codes documented in this report are preliminary and upon coder review may  ?be revised to meet current compliance requirements. ?Debhora Titus L.  Loletha Carrow, MD ?07/13/2021 9:58:34 AM ?This report has been signed electronically. ?Number of Addenda: 0 ?

## 2021-07-13 NOTE — Anesthesia Procedure Notes (Signed)
Procedure Name: [place] ?Date/Time: 07/13/2021 9:18 AM ?Performed by: Lollie Sails, CRNA ?Pre-anesthesia Checklist: Patient identified, Emergency Drugs available, Suction available, Patient being monitored and Timeout performed ?Oxygen Delivery Method: Simple face mask ?Placement Confirmation: positive ETCO2 ? ? ? ? ?

## 2021-07-13 NOTE — H&P (Signed)
History and Physical: ? This patient presents for endoscopic testing for: ?History of colon polyp. ? ?Clinical details in 05/27/2021 office progress note. ?Advanced adenomatous and serrated polyps on for screening colonoscopy March 2022.  Technical challenges of insufficient transverse colon polyp lift during EMR, loss of IV access with patient agitation, colon spasm. ?This is his first surveillance colonoscopy for removal of any remaining transverse colon polyp tissue as well as complete examination of the colon to identify any additional polyps ? ?Patient is otherwise without complaints or active issues today. ? ? ?Past Medical History: ?Past Medical History:  ?Diagnosis Date  ? Alcohol abuse   ? Asperger syndrome   ? Depression   ? Hyperlipidemia   ? Nicotine dependence   ? ? ? ?Past Surgical History: ?Past Surgical History:  ?Procedure Laterality Date  ? COLONOSCOPY  05/27/2020  ? Dr.Danis  ? TONSILLECTOMY    ? ? ?Allergies: ?No Known Allergies ? ?Outpatient Meds: ?Current Facility-Administered Medications  ?Medication Dose Route Frequency Provider Last Rate Last Admin  ? 0.9 %  sodium chloride infusion   Intravenous Continuous Danis, Estill Cotta III, MD      ? lactated ringers infusion    Continuous PRN Nelida Meuse III, MD 10 mL/hr at 07/13/21 0830 10 mL/hr at 07/13/21 0830  ? ? ? ? ?___________________________________________________________________ ?Objective  ? ?Exam: ? ?Ht 5' 7.5" (1.715 m)   Wt 81.6 kg   BMI 27.78 kg/m?  ? ?CV: RRR without murmur, S1/S2 ?Resp: clear to auscultation bilaterally, normal RR and effort noted ?GI: soft, no tenderness, with active bowel sounds. ? ? ?Assessment: ?History of colon polyps ? ? ?Plan: ?Colonoscopy ? The benefits and risks of the planned procedure were described in detail with the patient or (when appropriate) their health care proxy.  Risks were outlined as including, but not limited to, bleeding, infection, perforation, adverse medication reaction leading to cardiac  or pulmonary decompensation, pancreatitis (if ERCP).  The limitation of incomplete mucosal visualization was also discussed.  No guarantees or warranties were given. ? ? ? ?The patient is appropriate for an endoscopic procedure in the ambulatory setting. ? ? - Wilfrid Lund, MD ? ? ? ? ? ?

## 2021-07-13 NOTE — Interval H&P Note (Signed)
History and Physical Interval Note: ? ?07/13/2021 ?9:10 AM ? ?Christian Haley  has presented today for surgery, with the diagnosis of hx colon polyps.  The various methods of treatment have been discussed with the patient and family. After consideration of risks, benefits and other options for treatment, the patient has consented to  Procedure(s): ?COLONOSCOPY WITH PROPOFOL (N/A) as a surgical intervention.  The patient's history has been reviewed, patient examined, no change in status, stable for surgery.  I have reviewed the patient's chart and labs.  Questions were answered to the patient's satisfaction.   ? ? ?Nelida Meuse III ? ? ?

## 2021-07-14 ENCOUNTER — Encounter: Payer: Self-pay | Admitting: Gastroenterology

## 2021-07-14 ENCOUNTER — Encounter (HOSPITAL_COMMUNITY): Payer: Self-pay | Admitting: Gastroenterology

## 2021-07-14 LAB — SURGICAL PATHOLOGY

## 2021-07-16 DIAGNOSIS — F331 Major depressive disorder, recurrent, moderate: Secondary | ICD-10-CM | POA: Diagnosis not present

## 2021-08-18 DIAGNOSIS — D23112 Other benign neoplasm of skin of right lower eyelid, including canthus: Secondary | ICD-10-CM | POA: Diagnosis not present

## 2021-08-20 DIAGNOSIS — F331 Major depressive disorder, recurrent, moderate: Secondary | ICD-10-CM | POA: Diagnosis not present

## 2021-08-24 DIAGNOSIS — F419 Anxiety disorder, unspecified: Secondary | ICD-10-CM | POA: Diagnosis not present

## 2021-08-24 DIAGNOSIS — F840 Autistic disorder: Secondary | ICD-10-CM | POA: Diagnosis not present

## 2021-09-20 ENCOUNTER — Observation Stay (HOSPITAL_COMMUNITY)
Admission: EM | Admit: 2021-09-20 | Discharge: 2021-09-22 | Discharge status: Against Medical Advice | Payer: BC Managed Care – PPO | Attending: Family Medicine | Admitting: Family Medicine

## 2021-09-20 ENCOUNTER — Emergency Department (HOSPITAL_COMMUNITY): Payer: BC Managed Care – PPO

## 2021-09-20 ENCOUNTER — Other Ambulatory Visit: Payer: Self-pay

## 2021-09-20 ENCOUNTER — Encounter (HOSPITAL_COMMUNITY): Payer: Self-pay

## 2021-09-20 DIAGNOSIS — F10239 Alcohol dependence with withdrawal, unspecified: Principal | ICD-10-CM | POA: Insufficient documentation

## 2021-09-20 DIAGNOSIS — R9431 Abnormal electrocardiogram [ECG] [EKG]: Secondary | ICD-10-CM

## 2021-09-20 DIAGNOSIS — Z79899 Other long term (current) drug therapy: Secondary | ICD-10-CM | POA: Insufficient documentation

## 2021-09-20 DIAGNOSIS — F10232 Alcohol dependence with withdrawal with perceptual disturbance: Secondary | ICD-10-CM | POA: Diagnosis present

## 2021-09-20 DIAGNOSIS — F10939 Alcohol use, unspecified with withdrawal, unspecified: Secondary | ICD-10-CM

## 2021-09-20 DIAGNOSIS — E785 Hyperlipidemia, unspecified: Secondary | ICD-10-CM | POA: Diagnosis not present

## 2021-09-20 DIAGNOSIS — R1110 Vomiting, unspecified: Secondary | ICD-10-CM | POA: Diagnosis not present

## 2021-09-20 DIAGNOSIS — Z602 Problems related to living alone: Secondary | ICD-10-CM | POA: Diagnosis present

## 2021-09-20 DIAGNOSIS — R6510 Systemic inflammatory response syndrome (SIRS) of non-infectious origin without acute organ dysfunction: Secondary | ICD-10-CM | POA: Insufficient documentation

## 2021-09-20 DIAGNOSIS — K760 Fatty (change of) liver, not elsewhere classified: Secondary | ICD-10-CM | POA: Diagnosis not present

## 2021-09-20 DIAGNOSIS — E872 Acidosis, unspecified: Secondary | ICD-10-CM

## 2021-09-20 DIAGNOSIS — Z801 Family history of malignant neoplasm of trachea, bronchus and lung: Secondary | ICD-10-CM

## 2021-09-20 DIAGNOSIS — F10930 Alcohol use, unspecified with withdrawal, uncomplicated: Secondary | ICD-10-CM | POA: Diagnosis not present

## 2021-09-20 DIAGNOSIS — D72829 Elevated white blood cell count, unspecified: Secondary | ICD-10-CM

## 2021-09-20 DIAGNOSIS — F32A Depression, unspecified: Secondary | ICD-10-CM | POA: Diagnosis present

## 2021-09-20 DIAGNOSIS — F17210 Nicotine dependence, cigarettes, uncomplicated: Secondary | ICD-10-CM | POA: Diagnosis not present

## 2021-09-20 DIAGNOSIS — F339 Major depressive disorder, recurrent, unspecified: Secondary | ICD-10-CM | POA: Diagnosis not present

## 2021-09-20 DIAGNOSIS — I4892 Unspecified atrial flutter: Secondary | ICD-10-CM | POA: Diagnosis present

## 2021-09-20 DIAGNOSIS — F845 Asperger's syndrome: Secondary | ICD-10-CM | POA: Diagnosis present

## 2021-09-20 DIAGNOSIS — Y901 Blood alcohol level of 20-39 mg/100 ml: Secondary | ICD-10-CM | POA: Diagnosis present

## 2021-09-20 DIAGNOSIS — R000 Tachycardia, unspecified: Secondary | ICD-10-CM | POA: Diagnosis present

## 2021-09-20 DIAGNOSIS — E8721 Acute metabolic acidosis: Secondary | ICD-10-CM | POA: Diagnosis not present

## 2021-09-20 DIAGNOSIS — F10932 Alcohol use, unspecified with withdrawal with perceptual disturbance: Principal | ICD-10-CM

## 2021-09-20 DIAGNOSIS — I878 Other specified disorders of veins: Secondary | ICD-10-CM | POA: Diagnosis not present

## 2021-09-20 DIAGNOSIS — R0689 Other abnormalities of breathing: Secondary | ICD-10-CM | POA: Diagnosis not present

## 2021-09-20 DIAGNOSIS — I483 Typical atrial flutter: Secondary | ICD-10-CM | POA: Diagnosis not present

## 2021-09-20 DIAGNOSIS — Z800 Family history of malignant neoplasm of digestive organs: Secondary | ICD-10-CM

## 2021-09-20 DIAGNOSIS — E8729 Other acidosis: Secondary | ICD-10-CM | POA: Diagnosis present

## 2021-09-20 DIAGNOSIS — F19939 Other psychoactive substance use, unspecified with withdrawal, unspecified: Secondary | ICD-10-CM | POA: Diagnosis not present

## 2021-09-20 DIAGNOSIS — F840 Autistic disorder: Secondary | ICD-10-CM | POA: Diagnosis present

## 2021-09-20 DIAGNOSIS — E876 Hypokalemia: Secondary | ICD-10-CM | POA: Diagnosis present

## 2021-09-20 DIAGNOSIS — Z823 Family history of stroke: Secondary | ICD-10-CM

## 2021-09-20 DIAGNOSIS — Z5329 Procedure and treatment not carried out because of patient's decision for other reasons: Secondary | ICD-10-CM | POA: Diagnosis not present

## 2021-09-20 DIAGNOSIS — F10231 Alcohol dependence with withdrawal delirium: Secondary | ICD-10-CM | POA: Diagnosis present

## 2021-09-20 DIAGNOSIS — Z720 Tobacco use: Secondary | ICD-10-CM | POA: Diagnosis present

## 2021-09-20 DIAGNOSIS — K4090 Unilateral inguinal hernia, without obstruction or gangrene, not specified as recurrent: Secondary | ICD-10-CM | POA: Diagnosis not present

## 2021-09-20 LAB — COMPREHENSIVE METABOLIC PANEL
ALT: 30 U/L (ref 0–44)
AST: 44 U/L — ABNORMAL HIGH (ref 15–41)
Albumin: 4.7 g/dL (ref 3.5–5.0)
Alkaline Phosphatase: 85 U/L (ref 38–126)
Anion gap: 28 — ABNORMAL HIGH (ref 5–15)
BUN: <5 mg/dL — ABNORMAL LOW (ref 6–20)
CO2: 19 mmol/L — ABNORMAL LOW (ref 22–32)
Calcium: 9.5 mg/dL (ref 8.9–10.3)
Chloride: 89 mmol/L — ABNORMAL LOW (ref 98–111)
Creatinine, Ser: 0.95 mg/dL (ref 0.61–1.24)
GFR, Estimated: >60 mL/min (ref >60)
Glucose, Bld: 136 mg/dL — ABNORMAL HIGH (ref 70–99)
Potassium: 3.6 mmol/L (ref 3.5–5.1)
Sodium: 136 mmol/L (ref 135–145)
Total Bilirubin: 1.2 mg/dL (ref 0.3–1.2)
Total Protein: 8 g/dL (ref 6.5–8.1)

## 2021-09-20 LAB — URINALYSIS, ROUTINE W REFLEX MICROSCOPIC
Bacteria, UA: NONE SEEN
Bilirubin Urine: NEGATIVE
Glucose, UA: NEGATIVE mg/dL
Ketones, ur: NEGATIVE mg/dL
Leukocytes,Ua: NEGATIVE
Nitrite: NEGATIVE
Protein, ur: NEGATIVE mg/dL
Specific Gravity, Urine: 1.017 (ref 1.005–1.030)
pH: 7 (ref 5.0–8.0)

## 2021-09-20 LAB — CBC
HCT: 47 % (ref 39.0–52.0)
Hemoglobin: 16.7 g/dL (ref 13.0–17.0)
MCH: 30.4 pg (ref 26.0–34.0)
MCHC: 35.5 g/dL (ref 30.0–36.0)
MCV: 85.5 fL (ref 80.0–100.0)
Platelets: 475 10*3/uL — ABNORMAL HIGH (ref 150–400)
RBC: 5.5 MIL/uL (ref 4.22–5.81)
RDW: 12.4 % (ref 11.5–15.5)
WBC: 23.2 10*3/uL — ABNORMAL HIGH (ref 4.0–10.5)
nRBC: 0 % (ref 0.0–0.2)

## 2021-09-20 LAB — PROTIME-INR
INR: 1 (ref 0.8–1.2)
Prothrombin Time: 12.9 seconds (ref 11.4–15.2)

## 2021-09-20 LAB — APTT: aPTT: 27 seconds (ref 24–36)

## 2021-09-20 LAB — ETHANOL: Alcohol, Ethyl (B): 20 mg/dL — ABNORMAL HIGH (ref <10)

## 2021-09-20 LAB — RAPID URINE DRUG SCREEN, HOSP PERFORMED
Amphetamines: NOT DETECTED
Barbiturates: NOT DETECTED
Benzodiazepines: NOT DETECTED
Cocaine: NOT DETECTED
Opiates: NOT DETECTED
Tetrahydrocannabinol: NOT DETECTED

## 2021-09-20 LAB — HIV ANTIBODY (ROUTINE TESTING W REFLEX): HIV Screen 4th Generation wRfx: REACTIVE — AB

## 2021-09-20 LAB — SALICYLATE LEVEL: Salicylate Lvl: <7 mg/dL — ABNORMAL LOW (ref 7.0–30.0)

## 2021-09-20 LAB — TYPE AND SCREEN
ABO/RH(D): B NEG
Antibody Screen: NEGATIVE

## 2021-09-20 LAB — MAGNESIUM: Magnesium: 2.1 mg/dL (ref 1.7–2.4)

## 2021-09-20 LAB — ACETAMINOPHEN LEVEL: Acetaminophen (Tylenol), Serum: <10 ug/mL — ABNORMAL LOW (ref 10–30)

## 2021-09-20 LAB — ABO/RH: ABO/RH(D): B NEG

## 2021-09-20 LAB — TSH: TSH: 1.619 u[IU]/mL (ref 0.350–4.500)

## 2021-09-20 MED ORDER — ADULT MULTIVITAMIN W/MINERALS CH
1.0000 | ORAL_TABLET | Freq: Every day | ORAL | Status: DC
  Administered 2021-09-20 – 2021-09-22 (×3): 1 via ORAL
  Filled 2021-09-20 (×3): qty 1

## 2021-09-20 MED ORDER — HYDROXYZINE HCL 25 MG PO TABS: 25.0000 mg | ORAL_TABLET | Freq: Three times a day (TID) | ORAL | Status: DC | PRN

## 2021-09-20 MED ORDER — LORAZEPAM 2 MG/ML IJ SOLN
0.0000 mg | Freq: Four times a day (QID) | INTRAMUSCULAR | Status: DC
  Administered 2021-09-20 (×2): 2 mg via INTRAVENOUS
  Filled 2021-09-20: qty 2
  Filled 2021-09-20 (×2): qty 1

## 2021-09-20 MED ORDER — PAROXETINE HCL 20 MG PO TABS
20.0000 mg | ORAL_TABLET | Freq: Once | ORAL | Status: AC
Start: 2021-09-20 — End: 2021-09-20
  Administered 2021-09-20: 20 mg via ORAL
  Filled 2021-09-20 (×2): qty 1

## 2021-09-20 MED ORDER — FOLIC ACID 1 MG PO TABS
1.0000 mg | ORAL_TABLET | Freq: Every day | ORAL | Status: DC
Start: 2021-09-20 — End: 2021-09-22
  Administered 2021-09-20 – 2021-09-22 (×3): 1 mg via ORAL
  Filled 2021-09-20 (×3): qty 1

## 2021-09-20 MED ORDER — THIAMINE HCL 100 MG PO TABS
100.0000 mg | ORAL_TABLET | Freq: Every day | ORAL | Status: DC
Start: 2021-09-20 — End: 2021-09-22
  Administered 2021-09-22: 100 mg via ORAL
  Filled 2021-09-20 (×2): qty 1

## 2021-09-20 MED ORDER — SODIUM CHLORIDE 0.9 % IV SOLN: INTRAVENOUS | Status: DC

## 2021-09-20 MED ORDER — PANTOPRAZOLE SODIUM 40 MG IV SOLR
40.0000 mg | Freq: Once | INTRAVENOUS | Status: AC
  Administered 2021-09-20: 40 mg via INTRAVENOUS
  Filled 2021-09-20: qty 10

## 2021-09-20 MED ORDER — LORAZEPAM 2 MG/ML IJ SOLN
1.0000 mg | INTRAMUSCULAR | Status: DC | PRN
  Administered 2021-09-20 (×2): 2 mg via INTRAVENOUS

## 2021-09-20 MED ORDER — IOHEXOL 300 MG/ML  SOLN
100.0000 mL | Freq: Once | INTRAMUSCULAR | Status: AC | PRN
  Administered 2021-09-20: 100 mL via INTRAVENOUS

## 2021-09-20 MED ORDER — LACTATED RINGERS IV BOLUS
1000.0000 mL | Freq: Once | INTRAVENOUS | Status: AC
  Administered 2021-09-20: 1000 mL via INTRAVENOUS

## 2021-09-20 MED ORDER — THIAMINE HCL 100 MG/ML IJ SOLN
100.0000 mg | Freq: Every day | INTRAMUSCULAR | Status: DC
  Administered 2021-09-20: 100 mg via INTRAVENOUS
  Filled 2021-09-20: qty 2

## 2021-09-20 MED ORDER — ONDANSETRON HCL 4 MG/2ML IJ SOLN: 4.0000 mg | Freq: Four times a day (QID) | INTRAMUSCULAR | Status: DC | PRN

## 2021-09-20 MED ORDER — LORAZEPAM 2 MG/ML IJ SOLN
2.0000 mg | Freq: Once | INTRAMUSCULAR | Status: AC
  Administered 2021-09-20: 2 mg via INTRAVENOUS
  Filled 2021-09-20: qty 1

## 2021-09-20 MED ORDER — PAROXETINE HCL 20 MG PO TABS
20.0000 mg | ORAL_TABLET | Freq: Two times a day (BID) | ORAL | Status: DC
  Administered 2021-09-21 – 2021-09-22 (×3): 20 mg via ORAL
  Filled 2021-09-20 (×6): qty 1

## 2021-09-20 MED ORDER — METOPROLOL TARTRATE 5 MG/5ML IV SOLN
5.0000 mg | Freq: Once | INTRAVENOUS | Status: AC
Start: 2021-09-20 — End: 2021-09-20
  Administered 2021-09-20: 5 mg via INTRAVENOUS
  Filled 2021-09-20: qty 5

## 2021-09-20 MED ORDER — ATORVASTATIN CALCIUM 40 MG PO TABS
40.0000 mg | ORAL_TABLET | Freq: Every day | ORAL | Status: DC
  Administered 2021-09-21 – 2021-09-22 (×2): 40 mg via ORAL
  Filled 2021-09-20 (×2): qty 1

## 2021-09-20 MED ORDER — LORAZEPAM 2 MG/ML IJ SOLN: 0.0000 mg | Freq: Two times a day (BID) | INTRAMUSCULAR | Status: DC

## 2021-09-20 MED ORDER — LORAZEPAM 1 MG PO TABS
1.0000 mg | ORAL_TABLET | ORAL | Status: DC | PRN
  Administered 2021-09-20 – 2021-09-21 (×2): 4 mg via ORAL
  Administered 2021-09-22: 1 mg via ORAL
  Filled 2021-09-20: qty 1
  Filled 2021-09-20 (×2): qty 4

## 2021-09-20 MED ORDER — VITAMIN B-12 1000 MCG PO TABS
1000.0000 ug | ORAL_TABLET | Freq: Every day | ORAL | Status: DC
Start: 2021-09-21 — End: 2021-09-22
  Administered 2021-09-21 – 2021-09-22 (×2): 1000 ug via ORAL
  Filled 2021-09-20 (×3): qty 1

## 2021-09-20 MED ORDER — ACETAMINOPHEN 650 MG RE SUPP: 650.0000 mg | Freq: Four times a day (QID) | RECTAL | Status: DC | PRN

## 2021-09-20 MED ORDER — ENOXAPARIN SODIUM 40 MG/0.4ML IJ SOSY
40.0000 mg | PREFILLED_SYRINGE | INTRAMUSCULAR | Status: DC
  Administered 2021-09-21: 40 mg via SUBCUTANEOUS
  Filled 2021-09-20: qty 0.4

## 2021-09-20 MED ORDER — LORAZEPAM 2 MG/ML IJ SOLN
4.0000 mg | Freq: Once | INTRAMUSCULAR | Status: AC
Start: 2021-09-20 — End: 2021-09-20
  Administered 2021-09-20: 4 mg via INTRAVENOUS

## 2021-09-20 MED ORDER — ONDANSETRON HCL 4 MG/2ML IJ SOLN
4.0000 mg | Freq: Once | INTRAMUSCULAR | Status: AC
  Administered 2021-09-20: 4 mg via INTRAVENOUS
  Filled 2021-09-20: qty 2

## 2021-09-20 MED ORDER — ONDANSETRON HCL 4 MG PO TABS: 4.0000 mg | ORAL_TABLET | Freq: Four times a day (QID) | ORAL | Status: DC | PRN

## 2021-09-20 MED ORDER — ACETAMINOPHEN 325 MG PO TABS
650.0000 mg | ORAL_TABLET | Freq: Four times a day (QID) | ORAL | Status: DC | PRN
  Administered 2021-09-20: 650 mg via ORAL
  Filled 2021-09-20: qty 2

## 2021-09-20 NOTE — Assessment & Plan Note (Signed)
Lives alone.

## 2021-09-20 NOTE — Assessment & Plan Note (Signed)
Continue paxil °

## 2021-09-20 NOTE — ED Notes (Signed)
The pt has gotten out of bed 4 times in the past hour took off everything pulled off his ekg leads and voids in the floor

## 2021-09-20 NOTE — ED Notes (Signed)
The pt is sitting in the chair at  the bedside he has voided all over the floor pulled off his monitor and has disconnected his iv that is supposed to be running  apparently he has been doing this all day

## 2021-09-20 NOTE — ED Notes (Signed)
Sitter at the bedside.

## 2021-09-20 NOTE — H&P (Signed)
History and Physical    Patient: Christian Haley PVX:[id number] DOB: [birth year] DOA: 09/20/2021 DOS: the patient was seen and examined on 09/20/2021 PCP: Isaac Bliss, Rayford Halsted, MD  Patient coming from: Home - lives alone    Chief Complaint: N/V, tremors in setting of alcohol withdrawal   HPI: Christian Haley is a 46 y.o. male with medical history significant of alcohol abuse, autism-asperger syndrome, HLD, depression and tobacco abuse who presented to ED after binge drinking x 1 week. He has been drinking about 12 beers/day and stopped drinking last night. Has had nausea/vomiting/tremors since that time. Denies any abdominal pain. He states he had abstained from alcohol for a while, but then started back up last summer. He also admits to possible hallucination the past couple days. He saw his dead father. He was not frightened by this and he did not talk to him.   Denies any fever/chills, vision changes/headaches, chest pain or palpitations, shortness of breath or cough, abdominal pain, diarrhea, dysuria or leg swelling.    He smoke less than one pack per day of cigarettes.   Discussed with his family. He is able to live alone and can complete all ADLs, pay bills, drive etc. The main issue is the alcohol. He also lost his dad not long ago who lived with him. Interested in detox program.   ER Course:  vitals: afebrile, bp: 112/94, HR: 102, RR: 16, oxygen: 95% RA Pertinent labs: WBC: 23.2, ethanol 20, Co2:19, AG: 28,  CXR: no acute finding Abdomen xray: No evidence of bowel obstruction. Large metallic object and 2 smaller metallic objects overlying the right lower abdomen on one view and the right hip on the second view. Recommend correlation with exam. (Belt) In ED: given 2L IVF and started on scheduled CIWA protocol. CT abdomen ordered   Review of Systems: As mentioned in the history of present illness. All other systems reviewed and are negative. Past Medical History:   Diagnosis Date   Alcohol abuse    Asperger syndrome    Depression    Hyperlipidemia    Nicotine dependence    Past Surgical History:  Procedure Laterality Date   COLONOSCOPY  05/27/2020   Dr.Danis   COLONOSCOPY WITH PROPOFOL N/A 07/13/2021   Procedure: COLONOSCOPY WITH PROPOFOL;  Surgeon: Doran Stabler, MD;  Location: [facility];  Service: Gastroenterology;  Laterality: N/A;   POLYPECTOMY  07/13/2021   Procedure: POLYPECTOMY;  Surgeon: Doran Stabler, MD;  Location: [facility];  Service: Gastroenterology;;   TONSILLECTOMY     Social History:  reports that he has been smoking cigarettes. He has a 10.00 pack-year smoking history. He has never used smokeless tobacco. He reports that he does not currently use alcohol. He reports that he does not use drugs.  No Known Allergies  Family History  Problem Relation Age of Onset   Pancreatic cancer Father    CVA Paternal Grandmother    Lung cancer Paternal Grandfather    Colon cancer Neg Hx    Colon polyps Neg Hx    Esophageal cancer Neg Hx    Stomach cancer Neg Hx     Prior to Admission medications   Medication Sig Start Date End Date Taking? Authorizing Provider  atorvastatin (LIPITOR) 40 MG tablet Take 1 tablet (40 mg total) by mouth daily. 04/29/21   Isaac Bliss, Rayford Halsted, MD  Cholecalciferol (VITAMIN D) 50 MCG (2000 UT) tablet Take 2,000 Units by mouth daily.    [provider]  PARoxetine (PAXIL) 20 MG tablet Take 20 mg by mouth 2 (two) times daily. 11/03/19   [provider]  PEG-KCl-NaCl-NaSulf-Na Asc-C (PLENVU) 140 g SOLR Take 1 kit by mouth as directed. Use coupon: BIN: [id number] [facility]: CNRX Group: [id number] ID: [id number] 06/05/21   Doran Stabler, MD  vitamin B-12 (CYANOCOBALAMIN) 1000 MCG tablet Take 1,000 mcg by mouth daily.    [provider]    Physical Exam: Vitals:   09/20/21 1230 09/20/21 1308 09/20/21 1315 09/20/21 1345  BP: 132/86 136/89 (!) 126/107 127/87  Pulse: (!) 135  (!) 114 (!) 108 (!) 103  Resp: 15 (!) 29 (!) 23 (!) 22  Temp:      SpO2: 98% 98% 97% 98%   General:  Appears calm and comfortable and is in NAD. Mild tremor at times.  Eyes:  PERRL, EOMI, normal lids, iris ENT:  grossly normal hearing, lips & tongue, dry mucous membranes; poor dentition  Neck:  no LAD, masses or thyromegaly; no carotid bruits Cardiovascular:  sinus tachycardia, regular rhythm, no m/r/g. No LE edema.  Respiratory:   CTA bilaterally with no wheezes/rales/rhonchi.  Normal respiratory effort. Abdomen:  soft, NT, ND, NABS Back:   normal alignment, no CVAT Skin:  no rash or induration seen on limited exam Musculoskeletal:  grossly normal tone BUE/BLE, good ROM, no bony abnormality Lower extremity:  No LE edema.  Limited foot exam with no ulcerations.  2+ distal pulses. Psychiatric:  grossly normal mood and affect, speech fluent, with some stuttering and appropriate, AOx3 Neurologic:  CN 2-12 grossly intact, moves all extremities in coordinated fashion, sensation intact   Radiological Exams on Admission: Independently reviewed - see discussion in A/P where applicable  CT ABDOMEN PELVIS W CONTRAST  Result Date: 09/20/2021 CLINICAL DATA:  Abdominal pain, acute, nonlocalized EXAM: CT ABDOMEN AND PELVIS WITH CONTRAST TECHNIQUE: Multidetector CT imaging of the abdomen and pelvis was performed using the standard protocol following bolus administration of intravenous contrast. RADIATION DOSE REDUCTION: This exam was performed according to the departmental dose-optimization program which includes automated exposure control, adjustment of the mA and/or kV according to patient size and/or use of iterative reconstruction technique. CONTRAST:  175mL OMNIPAQUE IOHEXOL 300 MG/ML  SOLN COMPARISON:  None Available. FINDINGS: Lower chest: No acute abnormality. Hepatobiliary: Hepatic steatosis. There is a small right hepatic lobe hypodensity, too small to characterize but statistically likely to be a  small cyst or hemangioma. The gallbladder is unremarkable. Pancreas: Unremarkable. No pancreatic ductal dilatation or surrounding inflammatory changes. Spleen: Normal in size without focal abnormality. Adrenals/Urinary Tract: Adrenal glands are unremarkable. No hydronephrosis or nephrolithiasis. The bladder is moderately distended. Stomach/Bowel: Small hiatal hernia. There is distal esophageal wall thickening. The stomach is mildly distended. No evidence of bowel obstruction. The appendix is normal. Scattered colonic diverticula. Vascular/Lymphatic: Aortoiliac atherosclerosis. No AAA. No lymphadenopathy. Reproductive: Unremarkable. Other: Small fat containing inguinal hernias, right greater than left. No bowel containing hernia. No ascites. No free air. Musculoskeletal: No acute osseous abnormality. No suspicious osseous lesion. IMPRESSION: Distal esophageal wall thickening suggesting esophagitis. Small hiatal hernia. Hepatic steatosis. No evidence of bowel obstruction.  Normal appendix. Electronically Signed   By: Maurine Simmering M.D.   On: 09/20/2021 13:21   DG Abd Portable 2 Views  Result Date: 09/20/2021 CLINICAL DATA:  Vomiting EXAM: PORTABLE ABDOMEN - 2 VIEW COMPARISON:  None Available. FINDINGS: There is no evidence of bowel obstruction. No radiopaque calculi overlie the kidneys. No acute osseous abnormality. Right sided pelvic phlebolith noted. There  is a large metallic object and 2 smaller metallic objects overlying the right lower abdomen on one view and a hip on a second view. IMPRESSION: No evidence of bowel obstruction. Large metallic object and 2 smaller metallic objects overlying the right lower abdomen on one view and the right hip on the second view. Recommend correlation with exam. Electronically Signed   By: Maurine Simmering M.D.   On: 09/20/2021 10:27   DG Chest Port 1 View  Result Date: 09/20/2021 CLINICAL DATA:  Alcohol abuse, anxious, vomiting EXAM: PORTABLE CHEST 1 VIEW COMPARISON:  Radiograph  06/16/2011 FINDINGS: The cardiomediastinal silhouette is within normal limits. There is no focal airspace disease. There is no pleural effusion. No pneumothorax. There is no acute osseous abnormality. Mild bilateral shoulder degenerative changes. IMPRESSION: No evidence of acute cardiopulmonary disease. Electronically Signed   By: Maurine Simmering M.D.   On: 09/20/2021 10:21    EKG: Independently reviewed.  Sinus tachycardia with rate 147; nonspecific ST changes with no evidence of acute ischemia No previous ekg    Labs on Admission: I have personally reviewed the available labs and imaging studies at the time of the admission.  Pertinent labs:   WBC: 23.2, ethanol 20,  Co2:19,  AG: 28,   Assessment and Plan: Principal Problem:   Alcohol withdrawal ([place]) Active Problems:   SIRS without infection or organ dysfunction (HCC)   Metabolic acidosis   sinus tachy vs. flutter    Depression, recurrent (HCC)   Hyperlipidemia   Autism spectrum disorder   Tobacco abuse    Assessment and Plan: * Alcohol withdrawal ([place]) 46 year old presenting to ED with N/V, tachycardia, tremors after binge drinking x 1 week and stopping alcohol intake yesterday evening -admit to progressive -continue IVF -continue CIWA protocol -SW consult  -MV, thiamine, folic acid -CT abdomen pending from ED, but has no stomach pain, normal exam and no episodes of N/V while in Ed   SIRS without infection or organ dysfunction ([place]) Presenting with tachycardia, leukocytosis and metabolic acidosis in setting of alcohol withdrawal. No signs or symptoms of infection CXR clear, check UA Continue IVF, follow fever curve and trend cbc   sinus tachy vs. flutter  Initial heart rate of 147 with ekg with sinus tachycardia, but ? Flutter. edp gave him lopressor x 1 and heart rate came down to 100-110.  Continue on telemetry and monitor Repeat ekg  Check tsh   Metabolic acidosis Likely secondary to alcoholic ketoacidosis   Continue IVF, trend   Depression, recurrent ([place]) Continue paxil   Hyperlipidemia Continue lipitor   Autism spectrum disorder Lives alone.   Tobacco abuse Declines nicotine patch, would like to continue his lozenges prn     Advance Care Planning:   Code Status: Full Code   Consults: social worker   DVT Prophylaxis: lovenox   Family Communication: updated his aunt and uncle by phone-Libby and james: [phone].   Severity of Illness: The appropriate patient status for this patient is OBSERVATION. Observation status is judged to be reasonable and necessary in order to provide the required intensity of service to ensure the patient's safety. The patient's presenting symptoms, physical exam findings, and initial radiographic and laboratory data in the context of their medical condition is felt to place them at decreased risk for further clinical deterioration. Furthermore, it is anticipated that the patient will be medically stable for discharge from the hospital within 2 midnights of admission.   Author: Orma Flaming, MD 09/20/2021 2:42 PM  For  on call review [email or web address].

## 2021-09-20 NOTE — ED Notes (Signed)
Pt has been trying to get out of the bed to urinate in the sink. This RN informed the patient not do it again d/t safety concerns;however, the pt was not able to fully comprehend d/t autistic. MD made aware. Safety sitter ordered. Will continue monitor.

## 2021-09-20 NOTE — ED Triage Notes (Signed)
Patient arrived by [facility] after being on a drinking binge  1 week. Patient anxious and actively vomiting on arrival. Patient has autism. Patient with tremors and restless

## 2021-09-20 NOTE — Assessment & Plan Note (Addendum)
Initial heart rate of 147 with ekg with sinus tachycardia, but ? Flutter. edp gave him lopressor x 1 and heart rate came down to 100-110.  Continue on telemetry and monitor Repeat ekg  Check tsh

## 2021-09-20 NOTE — Assessment & Plan Note (Signed)
Declines nicotine patch, would like to continue his lozenges prn

## 2021-09-20 NOTE — Assessment & Plan Note (Signed)
46 year old presenting to ED with N/V, tachycardia, tremors after binge drinking x 1 week and stopping alcohol intake yesterday evening -admit to progressive -continue IVF -continue CIWA protocol -SW consult  -MV, thiamine, folic acid -CT abdomen pending from ED, but has no stomach pain, normal exam and no episodes of N/V while in Ed

## 2021-09-20 NOTE — Assessment & Plan Note (Signed)
Likely secondary to alcoholic ketoacidosis  Continue IVF, trend

## 2021-09-20 NOTE — Assessment & Plan Note (Signed)
Presenting with tachycardia, leukocytosis and metabolic acidosis in setting of alcohol withdrawal. No signs or symptoms of infection CXR clear, check UA Continue IVF, follow fever curve and trend cbc

## 2021-09-20 NOTE — ED Provider Notes (Signed)
[facility] EMERGENCY DEPARTMENT Provider Note   CSN: [id number] Arrival date & time: 09/20/21  5638     History  No chief complaint on file.   Christian Haley is a 46 y.o. male.  Patient presents with concern for alcohol withdrawal.  States he has been binging on beer for the past 1 week about 12 beers per day.  Last week was last evening.  States he does not drink on a regular basis other than this past week.  Presents with tremors, shakes and nausea and vomiting.  States unable to keep anything down at home.  Emesis has been yellow and brown in color.  Has had dark stools but no gross bleeding.  No fevers, chills, chest pain or shortness of breath.  Diffuse crampy abdominal pain. He has never had withdrawal like this in the past.  No seizures from alcohol withdrawal.  Denies any other drug use.  Denies any history of GI bleed.  Has had colonoscopies with no EGDs. Chart review shows history of alcohol abuse previously though he denies this.  The history is provided by the patient.       Home Medications Prior to Admission medications   Medication Sig Start Date End Date Taking? Authorizing Provider  atorvastatin (LIPITOR) 40 MG tablet Take 1 tablet (40 mg total) by mouth daily. 04/29/21   Isaac Bliss, Rayford Halsted, MD  Cholecalciferol (VITAMIN D) 50 MCG (2000 UT) tablet Take 2,000 Units by mouth daily.    [provider]  PARoxetine (PAXIL) 20 MG tablet Take 20 mg by mouth 2 (two) times daily. 11/03/19   [provider]  PEG-KCl-NaCl-NaSulf-Na Asc-C (PLENVU) 140 g SOLR Take 1 kit by mouth as directed. Use coupon: BIN: [id number] [facility]: [facility] Group: [id number] ID: [id number] 06/05/21   Doran Stabler, MD  vitamin B-12 (CYANOCOBALAMIN) 1000 MCG tablet Take 1,000 mcg by mouth daily.    [provider]      Allergies    Patient has no known allergies.    Review of Systems   Review of Systems  Constitutional:  Positive for activity  change and appetite change. Negative for fever.  HENT:  Negative for congestion.   Respiratory:  Negative for cough, chest tightness and shortness of breath.   Cardiovascular:  Negative for chest pain.  Gastrointestinal:  Positive for abdominal pain, nausea and vomiting.  Genitourinary:  Negative for dysuria and hematuria.  Musculoskeletal:  Negative for arthralgias and myalgias.  Skin:  Negative for wound.  Neurological:  Positive for weakness. Negative for dizziness and headaches.   all other systems are negative except as noted in the HPI and PMH.    Physical Exam Updated Vital Signs BP (!) 126/94   Pulse (!) 147   Temp 98.3 F (36.8 C)   Resp 16   SpO2 95%  Physical Exam Vitals and nursing note reviewed.  Constitutional:      General: He is in acute distress.     Appearance: He is well-developed. He is ill-appearing.     Comments: Anxious, tremulous, shaky, tachycardic  HENT:     Head: Normocephalic and atraumatic.     Mouth/Throat:     Pharynx: No oropharyngeal exudate.  Eyes:     Conjunctiva/sclera: Conjunctivae normal.     Pupils: Pupils are equal, round, and reactive to light.  Neck:     Comments: No meningismus. Cardiovascular:     Rate and Rhythm: Regular rhythm. Tachycardia present.     Heart  sounds: Normal heart sounds. No murmur heard. Pulmonary:     Effort: Pulmonary effort is normal. No respiratory distress.     Breath sounds: Normal breath sounds.  Abdominal:     Palpations: Abdomen is soft.     Tenderness: There is abdominal tenderness. There is no guarding or rebound.     Comments: Diffuse tenderness, no guarding or rebound  Musculoskeletal:        General: No tenderness. Normal range of motion.     Cervical back: Normal range of motion and neck supple.  Skin:    General: Skin is warm.  Neurological:     Mental Status: He is alert and oriented to person, place, and time.     Cranial Nerves: No cranial nerve deficit.     Motor: No abnormal muscle  tone.     Coordination: Coordination normal.     Comments:  5/5 strength throughout. CN 2-12 intact.Equal grip strength.   Psychiatric:        Behavior: Behavior normal.     ED Results / Procedures / Treatments   Labs (all labs ordered are listed, but only abnormal results are displayed) Labs Reviewed  COMPREHENSIVE METABOLIC PANEL - Abnormal; Notable for the following components:      Result Value   Chloride 89 (*)    CO2 19 (*)    Glucose, Bld 136 (*)    BUN <5 (*)    AST 44 (*)    Anion gap 28 (*)    All other components within normal limits  ETHANOL - Abnormal; Notable for the following components:   Alcohol, Ethyl (B) 20 (*)    All other components within normal limits  CBC - Abnormal; Notable for the following components:   WBC 23.2 (*)    Platelets 475 (*)    All other components within normal limits  SALICYLATE LEVEL - Abnormal; Notable for the following components:   Salicylate Lvl <2.3 (*)    All other components within normal limits  ACETAMINOPHEN LEVEL - Abnormal; Notable for the following components:   Acetaminophen (Tylenol), Serum <10 (*)    All other components within normal limits  RAPID URINE DRUG SCREEN, HOSP PERFORMED  MAGNESIUM  PROTIME-INR  APTT  URINALYSIS, ROUTINE W REFLEX MICROSCOPIC  POC OCCULT BLOOD, ED  TYPE AND SCREEN    EKG EKG Interpretation  Date/Time:  Sunday September 20 2021 08:50:22 EDT Ventricular Rate:  147 PR Interval:  128 QRS Duration: 78 QT Interval:  276 QTC Calculation: 431 R Axis:   110 Text Interpretation: Sinus tachycardia Biatrial enlargement Possible Anterolateral infarct , age undetermined Abnormal ECG No previous ECGs available No previous ECGs available Confirmed by Ezequiel Essex [phone]) on 09/20/2021 9:19:52 AM  Radiology CT ABDOMEN PELVIS W CONTRAST  Result Date: 09/20/2021 CLINICAL DATA:  Abdominal pain, acute, nonlocalized EXAM: CT ABDOMEN AND PELVIS WITH CONTRAST TECHNIQUE: Multidetector CT imaging of the  abdomen and pelvis was performed using the standard protocol following bolus administration of intravenous contrast. RADIATION DOSE REDUCTION: This exam was performed according to the departmental dose-optimization program which includes automated exposure control, adjustment of the mA and/or kV according to patient size and/or use of iterative reconstruction technique. CONTRAST:  149m OMNIPAQUE IOHEXOL 300 MG/ML  SOLN COMPARISON:  None Available. FINDINGS: Lower chest: No acute abnormality. Hepatobiliary: Hepatic steatosis. There is a small right hepatic lobe hypodensity, too small to characterize but statistically likely to be a small cyst or hemangioma. The gallbladder is unremarkable. Pancreas: Unremarkable. No pancreatic  ductal dilatation or surrounding inflammatory changes. Spleen: Normal in size without focal abnormality. Adrenals/Urinary Tract: Adrenal glands are unremarkable. No hydronephrosis or nephrolithiasis. The bladder is moderately distended. Stomach/Bowel: Small hiatal hernia. There is distal esophageal wall thickening. The stomach is mildly distended. No evidence of bowel obstruction. The appendix is normal. Scattered colonic diverticula. Vascular/Lymphatic: Aortoiliac atherosclerosis. No AAA. No lymphadenopathy. Reproductive: Unremarkable. Other: Small fat containing inguinal hernias, right greater than left. No bowel containing hernia. No ascites. No free air. Musculoskeletal: No acute osseous abnormality. No suspicious osseous lesion. IMPRESSION: Distal esophageal wall thickening suggesting esophagitis. Small hiatal hernia. Hepatic steatosis. No evidence of bowel obstruction.  Normal appendix. Electronically Signed   By: Maurine Simmering M.D.   On: 09/20/2021 13:21   DG Abd Portable 2 Views  Result Date: 09/20/2021 CLINICAL DATA:  Vomiting EXAM: PORTABLE ABDOMEN - 2 VIEW COMPARISON:  None Available. FINDINGS: There is no evidence of bowel obstruction. No radiopaque calculi overlie the kidneys. No  acute osseous abnormality. Right sided pelvic phlebolith noted. There is a large metallic object and 2 smaller metallic objects overlying the right lower abdomen on one view and a hip on a second view. IMPRESSION: No evidence of bowel obstruction. Large metallic object and 2 smaller metallic objects overlying the right lower abdomen on one view and the right hip on the second view. Recommend correlation with exam. Electronically Signed   By: Maurine Simmering M.D.   On: 09/20/2021 10:27   DG Chest Port 1 View  Result Date: 09/20/2021 CLINICAL DATA:  Alcohol abuse, anxious, vomiting EXAM: PORTABLE CHEST 1 VIEW COMPARISON:  Radiograph 06/16/2011 FINDINGS: The cardiomediastinal silhouette is within normal limits. There is no focal airspace disease. There is no pleural effusion. No pneumothorax. There is no acute osseous abnormality. Mild bilateral shoulder degenerative changes. IMPRESSION: No evidence of acute cardiopulmonary disease. Electronically Signed   By: Maurine Simmering M.D.   On: 09/20/2021 10:21    Procedures .Critical Care  Performed by: Ezequiel Essex, MD Authorized by: Ezequiel Essex, MD   Critical care provider statement:    Critical care time (minutes):  60   Critical care time was exclusive of:  Separately billable procedures and treating other patients   Critical care was necessary to treat or prevent imminent or life-threatening deterioration of the following conditions: delirium tremens.   Critical care was time spent personally by me on the following activities:  Development of treatment plan with patient or surrogate, discussions with consultants, evaluation of patient's response to treatment, examination of patient, ordering and review of laboratory studies, ordering and review of radiographic studies, ordering and performing treatments and interventions, pulse oximetry, re-evaluation of patient's condition and review of old charts     Medications Ordered in ED Medications  LORazepam  (ATIVAN) tablet 1-4 mg (has no administration in time range)    Or  LORazepam (ATIVAN) injection 1-4 mg (has no administration in time range)  thiamine tablet 100 mg ( Oral See Alternative 09/20/21 0949)    Or  thiamine (B-1) injection 100 mg (100 mg Intravenous Given 07/15/75 8242)  folic acid (FOLVITE) tablet 1 mg (has no administration in time range)  multivitamin with minerals tablet 1 tablet (has no administration in time range)  LORazepam (ATIVAN) injection 0-4 mg (2 mg Intravenous Given 09/20/21 0947)    Followed by  LORazepam (ATIVAN) injection 0-4 mg (has no administration in time range)  lactated ringers bolus 1,000 mL (has no administration in time range)  lactated ringers bolus 1,000 mL (has  no administration in time range)  pantoprazole (PROTONIX) injection 40 mg (has no administration in time range)  ondansetron (ZOFRAN) injection 4 mg (4 mg Intravenous Given 09/20/21 0949)    ED Course/ Medical Decision Making/ A&P                           Medical Decision Making Amount and/or Complexity of Data Reviewed Labs: ordered. Radiology: ordered.  Risk OTC drugs. Prescription drug management. Decision regarding hospitalization.   Concern for severe alcohol withdrawal after recent drinking binge.  Tachycardic to the 150s and tremulous.  Patient initiated on IV fluids, antiemetics and IV Ativan, thiamine and folate.  Hemoccult is negative but there is some concern for coffee-ground emesis.  Patient given IV fluids, antiemetics, IV Ativan per CIWA protocol.  Labs show significant leukocytosis of 23 metabolic acidosis with anion gap of 26 likely secondary to alcoholic ketosis. X-rays of abdomen and chest were performed to rule out free air given his recurrent vomiting and persistent tachycardia.  These were negative for free air or other acute surgical pathology.  Heart rate remains elevated in the 1 30-1 40 range despite multiple liters of IV fluids and Ativan.  EKG could be  consistent with atrial flutter and testing dose of Lopressor was given.  Heart rate did improve to the 110s. Appears the tachycardia may be secondary to atrial flutter likely secondary to his alcohol withdrawal.  CIWA scores and tremors are improving.  Do not feel patient needs Precedex infusion or ICU admission. Plan admission to progressive stepdown bed per hospitalist service.  CT scan of abdomen pelvis pending given his leukocytosis and persistent vomiting and tachycardia.  Admission discussed with Dr. Rogers Blocker.       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

## 2021-09-20 NOTE — Assessment & Plan Note (Signed)
Continue lipitor  ?

## 2021-09-21 DIAGNOSIS — E872 Acidosis, unspecified: Secondary | ICD-10-CM | POA: Diagnosis not present

## 2021-09-21 DIAGNOSIS — F10239 Alcohol dependence with withdrawal, unspecified: Secondary | ICD-10-CM | POA: Diagnosis not present

## 2021-09-21 DIAGNOSIS — F10930 Alcohol use, unspecified with withdrawal, uncomplicated: Secondary | ICD-10-CM | POA: Diagnosis not present

## 2021-09-21 DIAGNOSIS — F339 Major depressive disorder, recurrent, unspecified: Secondary | ICD-10-CM

## 2021-09-21 DIAGNOSIS — E785 Hyperlipidemia, unspecified: Secondary | ICD-10-CM | POA: Diagnosis not present

## 2021-09-21 DIAGNOSIS — F17210 Nicotine dependence, cigarettes, uncomplicated: Secondary | ICD-10-CM | POA: Diagnosis not present

## 2021-09-21 DIAGNOSIS — E8721 Acute metabolic acidosis: Secondary | ICD-10-CM | POA: Diagnosis not present

## 2021-09-21 DIAGNOSIS — R6510 Systemic inflammatory response syndrome (SIRS) of non-infectious origin without acute organ dysfunction: Secondary | ICD-10-CM | POA: Diagnosis not present

## 2021-09-21 DIAGNOSIS — Z79899 Other long term (current) drug therapy: Secondary | ICD-10-CM | POA: Diagnosis not present

## 2021-09-21 DIAGNOSIS — E782 Mixed hyperlipidemia: Secondary | ICD-10-CM | POA: Diagnosis not present

## 2021-09-21 LAB — CBC
HCT: 43.6 % (ref 39.0–52.0)
Hemoglobin: 14.5 g/dL (ref 13.0–17.0)
MCH: 30 pg (ref 26.0–34.0)
MCHC: 33.3 g/dL (ref 30.0–36.0)
MCV: 90.3 fL (ref 80.0–100.0)
Platelets: 305 10*3/uL (ref 150–400)
RBC: 4.83 MIL/uL (ref 4.22–5.81)
RDW: 12.6 % (ref 11.5–15.5)
WBC: 11.3 10*3/uL — ABNORMAL HIGH (ref 4.0–10.5)
nRBC: 0 % (ref 0.0–0.2)

## 2021-09-21 LAB — BASIC METABOLIC PANEL
Anion gap: 12 (ref 5–15)
BUN: 5 mg/dL — ABNORMAL LOW (ref 6–20)
CO2: 29 mmol/L (ref 22–32)
Calcium: 9 mg/dL (ref 8.9–10.3)
Chloride: 95 mmol/L — ABNORMAL LOW (ref 98–111)
Creatinine, Ser: 0.9 mg/dL (ref 0.61–1.24)
GFR, Estimated: >60 mL/min (ref >60)
Glucose, Bld: 86 mg/dL (ref 70–99)
Potassium: 3.4 mmol/L — ABNORMAL LOW (ref 3.5–5.1)
Sodium: 136 mmol/L (ref 135–145)

## 2021-09-21 MED ORDER — PANTOPRAZOLE SODIUM 40 MG PO TBEC
40.0000 mg | DELAYED_RELEASE_TABLET | Freq: Two times a day (BID) | ORAL | Status: DC
  Administered 2021-09-21 – 2021-09-22 (×3): 40 mg via ORAL
  Filled 2021-09-21 (×3): qty 1

## 2021-09-21 MED ORDER — CHLORDIAZEPOXIDE HCL 25 MG PO CAPS
25.0000 mg | ORAL_CAPSULE | Freq: Three times a day (TID) | ORAL | Status: DC
  Administered 2021-09-21 – 2021-09-22 (×4): 25 mg via ORAL
  Filled 2021-09-21 (×4): qty 1

## 2021-09-21 MED ORDER — NICOTINE 14 MG/24HR TD PT24
14.0000 mg | MEDICATED_PATCH | Freq: Every day | TRANSDERMAL | Status: DC
Start: 2021-09-21 — End: 2021-09-22
  Administered 2021-09-21 – 2021-09-22 (×2): 14 mg via TRANSDERMAL
  Filled 2021-09-21 (×2): qty 1

## 2021-09-21 MED ORDER — POTASSIUM CHLORIDE CRYS ER 20 MEQ PO TBCR
40.0000 meq | EXTENDED_RELEASE_TABLET | Freq: Once | ORAL | Status: AC
  Administered 2021-09-21: 40 meq via ORAL
  Filled 2021-09-21: qty 2

## 2021-09-21 NOTE — Progress Notes (Signed)
Safety sitter going to lunch at this time for 30 minutes. Nurse Engineer, production notified of situation. States that no tech is needed for [employer] cabin crew lunch breaks.

## 2021-09-21 NOTE — ED Notes (Signed)
Breakfast order placed ?

## 2021-09-21 NOTE — ED Notes (Signed)
The pt has been sleeping since 2230 sitter at the  bedside

## 2021-09-21 NOTE — Progress Notes (Signed)
PROGRESS NOTE    Christian Haley  DPO:[id number] DOB: [birth year] DOA: 09/20/2021 PCP: Isaac Bliss, Rayford Halsted, MD   Brief Narrative:  HPI: Christian Haley is a 46 y.o. male with medical history significant of alcohol abuse, autism-asperger syndrome, HLD, depression and tobacco abuse who presented to ED after binge drinking x 1 week. He has been drinking about 12 beers/day and stopped drinking last night. Has had nausea/vomiting/tremors since that time. Denies any abdominal pain. He states he had abstained from alcohol for a while, but then started back up last summer. He also admits to possible hallucination the past couple days. He saw his dead father. He was not frightened by this and he did not talk to him.    Denies any fever/chills, vision changes/headaches, chest pain or palpitations, shortness of breath or cough, abdominal pain, diarrhea, dysuria or leg swelling.      He smoke less than one pack per day of cigarettes.    Discussed with his family. He is able to live alone and can complete all ADLs, pay bills, drive etc. The main issue is the alcohol. He also lost his dad not long ago who lived with him. Interested in detox program.    ER Course:  vitals: afebrile, bp: 112/94, HR: 102, RR: 16, oxygen: 95% RA Pertinent labs: WBC: 23.2, ethanol 20, Co2:19, AG: 28,  CXR: no acute finding Abdomen xray: No evidence of bowel obstruction. Large metallic object and 2 smaller metallic objects overlying the right lower abdomen on one view and the right hip on the second view. Recommend correlation with exam. ([place]) In ED: given 2L IVF and started on scheduled CIWA protocol. CT abdomen ordered    Assessment & Plan:   Principal Problem:   Alcohol withdrawal ([place]) Active Problems:   SIRS without infection or organ dysfunction (HCC)   Metabolic acidosis   sinus tachy vs. flutter    Depression, recurrent ([place])   Hyperlipidemia   Autism spectrum disorder   Tobacco  abuse  Alcohol withdrawal [facility]): Patient actively withdrawing with CIWA catching as high as 15 yesterday requiring multiple doses of as needed Ativan.  Will start on Librium scheduled 25-minute 3 times daily and continue as needed Ativan with CIWA protocol multivitamin.   SIRS without infection or organ dysfunction ([place]) Presenting with tachycardia, leukocytosis and metabolic acidosis in setting of alcohol withdrawal. No signs or symptoms of infection CXR clear, UA unremarkable.  Continue IV fluid (increased frequency to 125 cc/h) and follow closely for any signs of infection.  sinus tachy vs. flutter: Initial heart rate of 147 with ekg with sinus tachycardia, but ? Flutter. edp gave him lopressor x 1 and heart rate came down to 100-110.  Continue on telemetry and monitor.  TSH normal.  Heart rate ranging between 110-125 but he is in sinus rhythm.  This is likely secondary to alcohol withdrawal.  Hypokalemia: Replace.  Metabolic acidosis: Likely secondary to alcoholic ketoacidosis.  Resolved.   Depression, recurrent ([place]) Continue paxil    Hyperlipidemia Continue lipitor    Autism spectrum disorder Lives alone.  Able to do his ADLs.   Tobacco abuse Declines nicotine patch, would like to continue his lozenges prn    DVT prophylaxis: enoxaparin (LOVENOX) injection 40 mg Start: 09/20/21 2200   Code Status: Full Code  Family Communication:  None present at bedside.  Plan of care discussed with patient in length and he/she verbalized understanding and agreed with it.  Status is: Observation The patient will require care  spanning > 2 midnights and should be moved to inpatient because: Actively withdrawing, will need hospitalization for alcohol detoxification.   Estimated body mass index is 27.76 kg/m as calculated from the following:   Height as of 07/13/21: 5' 7.5" (1.715 m).   Weight as of 07/13/21: 81.6 kg.    Nutritional Assessment: There is no height or weight on file to  calculate BMI.. Seen by dietician.  I agree with the assessment and plan as outlined below: Nutrition Status:        . Skin Assessment: I have examined the patient's skin and I agree with the wound assessment as performed by the wound care RN as outlined below:    Consultants:  None  Procedures:  None  Antimicrobials:  Anti-infectives (From admission, onward)    None         Subjective: Patient seen and examined in the ED.  Sitter at the bedside.  Patient sleepy due to receiving multiple doses of Ativan.  He is partially oriented for the same reason as well.  He has no complaints.  He could not tell me how much he drinks and how often he does.  He denied doing any other illegal drugs.  Objective: Vitals:   09/20/21 2200 09/20/21 2245 09/20/21 2328 09/21/21 0315  BP: (!) 111/92  132/79 114/71  Pulse: (!) 110 (!) 117 (!) 111   Resp: 10  19   Temp:      SpO2: 99%  99%     Intake/Output Summary (Last 24 hours) at 09/21/2021 0818 Last data filed at 09/21/2021 0419 Gross per 24 hour  Intake --  Output 400 ml  Net -400 ml   There were no vitals filed for this visit.  Examination:  General exam: Appears calm and comfortable but sleepy Respiratory system: Clear to auscultation. Respiratory effort normal. Cardiovascular system: S1 & S2 heard, RRR. No JVD, murmurs, rubs, gallops or clicks. No pedal edema. Gastrointestinal system: Abdomen is nondistended, soft and nontender. No organomegaly or masses felt. Normal bowel sounds heard. Central nervous system: Alert and partially oriented. No focal neurological deficits. Extremities: Symmetric 5 x 5 power. Skin: No rashes, lesions or ulcers    Data Reviewed: I have personally reviewed following labs and imaging studies  CBC: Recent Labs  Lab 09/20/21 0847 09/21/21 0411  WBC 23.2* 11.3*  HGB 16.7 14.5  HCT 47.0 43.6  MCV 85.5 90.3  PLT 475* 505   Basic Metabolic Panel: Recent Labs  Lab 09/20/21 0847  09/20/21 0921 09/21/21 0411  NA 136  --  136  K 3.6  --  3.4*  CL 89*  --  95*  CO2 19*  --  29  GLUCOSE 136*  --  86  BUN <5*  --  5*  CREATININE 0.95  --  0.90  CALCIUM 9.5  --  9.0  MG  --  2.1  --    GFR: CrCl cannot be calculated (Unknown ideal weight.). Liver Function Tests: Recent Labs  Lab 09/20/21 0847  AST 44*  ALT 30  ALKPHOS 85  BILITOT 1.2  PROT 8.0  ALBUMIN 4.7   No results for input(s): "LIPASE", "AMYLASE" in the last 168 hours. No results for input(s): "AMMONIA" in the last 168 hours. Coagulation Profile: Recent Labs  Lab 09/20/21 0948  INR 1.0   Cardiac Enzymes: No results for input(s): "CKTOTAL", "CKMB", "CKMBINDEX", "TROPONINI" in the last 168 hours. BNP (last 3 results) No results for input(s): "PROBNP" in the last 8760 hours.  HbA1C: No results for input(s): "HGBA1C" in the last 72 hours. CBG: No results for input(s): "GLUCAP" in the last 168 hours. Lipid Profile: No results for input(s): "CHOL", "HDL", "LDLCALC", "TRIG", "CHOLHDL", "LDLDIRECT" in the last 72 hours. Thyroid Function Tests: Recent Labs    09/20/21 1331  TSH 1.619   Anemia Panel: No results for input(s): "VITAMINB12", "FOLATE", "FERRITIN", "TIBC", "IRON", "RETICCTPCT" in the last 72 hours. Sepsis Labs: No results for input(s): "PROCALCITON", "LATICACIDVEN" in the last 168 hours.  No results found for this or any previous visit (from the past 240 hour(s)).   Radiology Studies: CT ABDOMEN PELVIS W CONTRAST  Result Date: 09/20/2021 CLINICAL DATA:  Abdominal pain, acute, nonlocalized EXAM: CT ABDOMEN AND PELVIS WITH CONTRAST TECHNIQUE: Multidetector CT imaging of the abdomen and pelvis was performed using the standard protocol following bolus administration of intravenous contrast. RADIATION DOSE REDUCTION: This exam was performed according to the departmental dose-optimization program which includes automated exposure control, adjustment of the mA and/or kV according to  patient size and/or use of iterative reconstruction technique. CONTRAST:  18m OMNIPAQUE IOHEXOL 300 MG/ML  SOLN COMPARISON:  None Available. FINDINGS: Lower chest: No acute abnormality. Hepatobiliary: Hepatic steatosis. There is a small right hepatic lobe hypodensity, too small to characterize but statistically likely to be a small cyst or hemangioma. The gallbladder is unremarkable. Pancreas: Unremarkable. No pancreatic ductal dilatation or surrounding inflammatory changes. Spleen: Normal in size without focal abnormality. Adrenals/Urinary Tract: Adrenal glands are unremarkable. No hydronephrosis or nephrolithiasis. The bladder is moderately distended. Stomach/Bowel: Small hiatal hernia. There is distal esophageal wall thickening. The stomach is mildly distended. No evidence of bowel obstruction. The appendix is normal. Scattered colonic diverticula. Vascular/Lymphatic: Aortoiliac atherosclerosis. No AAA. No lymphadenopathy. Reproductive: Unremarkable. Other: Small fat containing inguinal hernias, right greater than left. No bowel containing hernia. No ascites. No free air. Musculoskeletal: No acute osseous abnormality. No suspicious osseous lesion. IMPRESSION: Distal esophageal wall thickening suggesting esophagitis. Small hiatal hernia. Hepatic steatosis. No evidence of bowel obstruction.  Normal appendix. Electronically Signed   By: JMaurine SimmeringM.D.   On: 09/20/2021 13:21   DG Abd Portable 2 Views  Result Date: 09/20/2021 CLINICAL DATA:  Vomiting EXAM: PORTABLE ABDOMEN - 2 VIEW COMPARISON:  None Available. FINDINGS: There is no evidence of bowel obstruction. No radiopaque calculi overlie the kidneys. No acute osseous abnormality. Right sided pelvic phlebolith noted. There is a large metallic object and 2 smaller metallic objects overlying the right lower abdomen on one view and a hip on a second view. IMPRESSION: No evidence of bowel obstruction. Large metallic object and 2 smaller metallic objects  overlying the right lower abdomen on one view and the right hip on the second view. Recommend correlation with exam. Electronically Signed   By: JMaurine SimmeringM.D.   On: 09/20/2021 10:27   DG Chest Port 1 View  Result Date: 09/20/2021 CLINICAL DATA:  Alcohol abuse, anxious, vomiting EXAM: PORTABLE CHEST 1 VIEW COMPARISON:  Radiograph 06/16/2011 FINDINGS: The cardiomediastinal silhouette is within normal limits. There is no focal airspace disease. There is no pleural effusion. No pneumothorax. There is no acute osseous abnormality. Mild bilateral shoulder degenerative changes. IMPRESSION: No evidence of acute cardiopulmonary disease. Electronically Signed   By: JMaurine SimmeringM.D.   On: 09/20/2021 10:21    Scheduled Meds:  atorvastatin  40 mg Oral Daily   chlordiazePOXIDE  25 mg Oral TID   enoxaparin (LOVENOX) injection  40 mg Subcutaneous QI20B  folic acid  1 mg  Oral Daily   multivitamin with minerals  1 tablet Oral Daily   PARoxetine  20 mg Oral BID   potassium chloride  40 mEq Oral Once   thiamine  100 mg Oral Daily   Or   thiamine  100 mg Intravenous Daily   vitamin B-12  1,000 mcg Oral Daily   Continuous Infusions:  sodium chloride 75 mL/hr at 09/20/21 1711     LOS: 0 days   Darliss Cheney, MD [facility]  09/21/2021, 8:18 AM   *Please note that this is a verbal dictation therefore any spelling or grammatical errors are due to the "[place] [facility]" system interpretation.  Please page via [place] and do not message via secure chat for urgent patient care matters. Secure chat can be used for non urgent patient care matters.  How to contact the [facility] Attending or Consulting provider [place] or covering provider during after hours [place], for this patient?  Check the care team in [facility] and look for a) attending/consulting [facility] provider listed and b) the [facility] team listed. Page or secure chat [phone]. Log into [email or web address] and use [place]'s universal password to access. If you do not  have the password, please contact the hospital operator. Locate the [facility] provider you are looking for under [facility] and page to a number that you can be directly reached. If you still have difficulty reaching the provider, please page the [facility] (Director on Call) for the Hospitalists listed on amion for assistance.

## 2021-09-21 NOTE — ED Notes (Signed)
Pt sleeping soundly  with sitter at  the bedside

## 2021-09-21 NOTE — Progress Notes (Signed)
Earlier around 12:20 PM, I was informed by primary RN Moon that patient was wanting to leave the hospital.  I went and talked to the patient.  This time patient was fully alert and oriented.  Sitter was at the bedside.  I did explain to him that he is not safe for discharge at the moment due to unsteady gait and that he is having withdrawal symptoms.  He was able to comprehend the rationalization of being in the hospital.  We did discuss that he will potentially be discharged either on Wednesday or on Thursday based on how he responds to the treatment but there were no guarantees.  He was also informed about his rights to leave [place] if he still wishes to go home.  He verbalized understanding.  This was my second time seeing this patient today.

## 2021-09-21 NOTE — ED Notes (Signed)
Admitting MD came down and spoke with patient and explained the importance of needing to stay.

## 2021-09-22 DIAGNOSIS — F10239 Alcohol dependence with withdrawal, unspecified: Secondary | ICD-10-CM | POA: Diagnosis not present

## 2021-09-22 DIAGNOSIS — F10930 Alcohol use, unspecified with withdrawal, uncomplicated: Secondary | ICD-10-CM | POA: Diagnosis not present

## 2021-09-22 DIAGNOSIS — R6510 Systemic inflammatory response syndrome (SIRS) of non-infectious origin without acute organ dysfunction: Secondary | ICD-10-CM | POA: Diagnosis not present

## 2021-09-22 DIAGNOSIS — F339 Major depressive disorder, recurrent, unspecified: Secondary | ICD-10-CM | POA: Diagnosis not present

## 2021-09-22 DIAGNOSIS — Z79899 Other long term (current) drug therapy: Secondary | ICD-10-CM | POA: Diagnosis not present

## 2021-09-22 DIAGNOSIS — E785 Hyperlipidemia, unspecified: Secondary | ICD-10-CM | POA: Diagnosis not present

## 2021-09-22 DIAGNOSIS — F17210 Nicotine dependence, cigarettes, uncomplicated: Secondary | ICD-10-CM | POA: Diagnosis not present

## 2021-09-22 DIAGNOSIS — E8721 Acute metabolic acidosis: Secondary | ICD-10-CM | POA: Diagnosis not present

## 2021-09-22 LAB — BASIC METABOLIC PANEL
Anion gap: 13 (ref 5–15)
BUN: 12 mg/dL (ref 6–20)
CO2: 25 mmol/L (ref 22–32)
Calcium: 8.7 mg/dL — ABNORMAL LOW (ref 8.9–10.3)
Chloride: 105 mmol/L (ref 98–111)
Creatinine, Ser: 0.71 mg/dL (ref 0.61–1.24)
GFR, Estimated: >60 mL/min (ref >60)
Glucose, Bld: 87 mg/dL (ref 70–99)
Potassium: 3.8 mmol/L (ref 3.5–5.1)
Sodium: 143 mmol/L (ref 135–145)

## 2021-09-22 LAB — HIV-1 RNA QUANT-NO REFLEX-BLD
HIV 1 RNA Quant: <20 copies/mL
LOG10 HIV-1 RNA: UNDETERMINED log10copy/mL

## 2021-09-22 LAB — MAGNESIUM: Magnesium: 1.9 mg/dL (ref 1.7–2.4)

## 2021-09-22 NOTE — Progress Notes (Signed)
  Transition of Care [facility]) Screening Note   Patient Details  Name: Christian Haley Date of Birth: 07/23/1975   Transition of Care [facility]) CM/SW Contact:    Cyndi Bender, RN Phone Number: 09/22/2021, 9:13 AM    Transition of Care Department [facility]) has reviewed patient. Patient needs substance abuse counseling. We will continue to monitor patient advancement through interdisciplinary progression rounds. If new patient transition needs arise, please place a TOC consult.

## 2021-09-22 NOTE — Progress Notes (Signed)
Pt signed leaving hospital against medical paper, MD made aware.

## 2021-09-22 NOTE — Discharge Summary (Signed)
PatientPhysician Discharge Summary  Christian Haley IRJ:[id number] DOB: 01-09-1976 DOA: 09/20/2021  PCP: Isaac Bliss, Rayford Halsted, MD  Admit date: 09/20/2021 Discharge date: 09/22/2021 30 Day Unplanned Readmission Risk Score    Flowsheet Row ED to Hosp-Admission (Current) from 09/20/2021 in [facility]  30 Day Unplanned Readmission Risk Score (%) 9.7 Filed at 09/22/2021 1200       This score is the patient's risk of an unplanned readmission within 30 days of being discharged (0 -100%). The score is based on dignosis, age, lab data, medications, orders, and past utilization.   Low:  0-14.9   Medium: 15-21.9   High: 22-29.9   Extreme: 30 and above          Admitted From: Home Disposition: Left AGAINST MEDICAL ADVICE  Recommendations for Outpatient Follow-up:  Follow up with PCP in 1-2 weeks Please obtain BMP/CBC in one week Please follow up with your PCP on the following pending results: Unresulted Labs (From admission, onward)     Start     Ordered   09/21/21 0854  HIV-1 RNA quant-no reflex-bld  Once,   R        09/21/21 0854   09/20/21 1331  HIV-1/2 AB - differentiation  Once,   R        09/20/21 [place]: None Equipment/Devices: None  Discharge Condition: Poor CODE STATUS: Full code Diet recommendation: Cardiac  Subjective: Seen and examined.  He was doing much better today and well.  He did not have any tremors.  He had not received any as needed Ativan in last 24 hours, likely secondary to receiving Librium.  This morning again, he was asking when he will be discharged.  I reminded him that he was not medically stable yet and that he needs to stay in the hospital for at least 1-2 more days so we can fully treat his alcohol withdrawal.  He was much more alert, coherent and with full capacity to make his decisions.  He talked about signing AMA.  Eventually, patient decided to do that and he left AGAINST MEDICAL  ADVICE.  following HPI and ED course is copied from admitting H&P. HPI: Christian Haley is a 46 y.o. male with medical history significant of alcohol abuse, autism-asperger syndrome, HLD, depression and tobacco abuse who presented to ED after binge drinking x 1 week. He has been drinking about 12 beers/day and stopped drinking last night. Has had nausea/vomiting/tremors since that time. Denies any abdominal pain. He states he had abstained from alcohol for a while, but then started back up last summer. He also admits to possible hallucination the past couple days. He saw his dead father. He was not frightened by this and he did not talk to him.    Denies any fever/chills, vision changes/headaches, chest pain or palpitations, shortness of breath or cough, abdominal pain, diarrhea, dysuria or leg swelling.      He smoke less than one pack per day of cigarettes.    Discussed with his family. He is able to live alone and can complete all ADLs, pay bills, drive etc. The main issue is the alcohol. He also lost his dad not long ago who lived with him. Interested in detox program.    ER Course:  vitals: afebrile, bp: 112/94, HR: 102, RR: 16, oxygen: 95% RA Pertinent labs: WBC: 23.2, ethanol 20, Co2:19, AG: 28,  CXR: no  acute finding Abdomen xray: No evidence of bowel obstruction. Large metallic object and 2 smaller metallic objects overlying the right lower abdomen on one view and the right hip on the second view. Recommend correlation with exam. ([place]) In ED: given 2L IVF and started on scheduled CIWA protocol. CT abdomen ordered      Brief/Interim Summary:  Alcohol withdrawal [facility]): Patient actively withdrawing with CIWA catching as high as 15 on day 1, he was started on Librium which helped significantly, he did not require further as needed IV Ativan until this morning.  He eventually decided to leave [place].   SIRS without infection or organ dysfunction ([place]) Presenting  with tachycardia, leukocytosis and metabolic acidosis in setting of alcohol withdrawal. No signs or symptoms of infection CXR clear, UA unremarkable.   sinus tachy vs. flutter: Initial heart rate of 147 with ekg with sinus tachycardia, but ? Flutter. edp gave him lopressor x 1 and heart rate came down to 100-110.  This morning his heart rate was around 90.   Hypokalemia: Resolved.   Metabolic acidosis: Likely secondary to alcoholic ketoacidosis.  Resolved.   Depression, recurrent ([place]) Continue paxil    Hyperlipidemia Continue lipitor    Autism spectrum disorder Lives alone.  Able to do his ADLs.   Tobacco abuse: Counseling provided to quit smoking.  Discharge Diagnoses:  Principal Problem:   Alcohol withdrawal ([place]) Active Problems:   SIRS without infection or organ dysfunction (H[facility])   Metabolic acidosis   sinus tachy vs. flutter    Depression, recurrent ([place])   Hyperlipidemia   Autism spectrum disorder   Tobacco abuse    Discharge Instructions   Allergies as of 09/22/2021   No Known Allergies      Medication List     TAKE these medications    atorvastatin 40 MG tablet Commonly known as: LIPITOR Take 1 tablet (40 mg total) by mouth daily.   PARoxetine 20 MG tablet Commonly known as: PAXIL Take 20 mg by mouth 2 (two) times daily.   Plenvu 140 g Solr Generic drug: PEG-KCl-NaCl-NaSulf-Na Asc-C Take 1 kit by mouth as directed. Use coupon: BIN: [id number] PNC: CNRX Group: [id number] ID: [id number]   vitamin B-12 1000 MCG tablet Commonly known as: CYANOCOBALAMIN Take 1,000 mcg by mouth daily.   Vitamin D 50 MCG (2000 UT) tablet Take 2,000 Units by mouth daily.        No Known Allergies  Consultations: None   Procedures/Studies: CT ABDOMEN PELVIS W CONTRAST  Result Date: 09/20/2021 CLINICAL DATA:  Abdominal pain, acute, nonlocalized EXAM: CT ABDOMEN AND PELVIS WITH CONTRAST TECHNIQUE: Multidetector CT imaging of the abdomen and pelvis was  performed using the standard protocol following bolus administration of intravenous contrast. RADIATION DOSE REDUCTION: This exam was performed according to the departmental dose-optimization program which includes automated exposure control, adjustment of the mA and/or kV according to patient size and/or use of iterative reconstruction technique. CONTRAST:  130m OMNIPAQUE IOHEXOL 300 MG/ML  SOLN COMPARISON:  None Available. FINDINGS: Lower chest: No acute abnormality. Hepatobiliary: Hepatic steatosis. There is a small right hepatic lobe hypodensity, too small to characterize but statistically likely to be a small cyst or hemangioma. The gallbladder is unremarkable. Pancreas: Unremarkable. No pancreatic ductal dilatation or surrounding inflammatory changes. Spleen: Normal in size without focal abnormality. Adrenals/Urinary Tract: Adrenal glands are unremarkable. No hydronephrosis or nephrolithiasis. The bladder is moderately distended. Stomach/Bowel: Small hiatal hernia. There is distal esophageal wall thickening. The stomach is mildly distended. No evidence of  bowel obstruction. The appendix is normal. Scattered colonic diverticula. Vascular/Lymphatic: Aortoiliac atherosclerosis. No AAA. No lymphadenopathy. Reproductive: Unremarkable. Other: Small fat containing inguinal hernias, right greater than left. No bowel containing hernia. No ascites. No free air. Musculoskeletal: No acute osseous abnormality. No suspicious osseous lesion. IMPRESSION: Distal esophageal wall thickening suggesting esophagitis. Small hiatal hernia. Hepatic steatosis. No evidence of bowel obstruction.  Normal appendix. Electronically Signed   By: Maurine Simmering M.D.   On: 09/20/2021 13:21   DG Abd Portable 2 Views  Result Date: 09/20/2021 CLINICAL DATA:  Vomiting EXAM: PORTABLE ABDOMEN - 2 VIEW COMPARISON:  None Available. FINDINGS: There is no evidence of bowel obstruction. No radiopaque calculi overlie the kidneys. No acute osseous  abnormality. Right sided pelvic phlebolith noted. There is a large metallic object and 2 smaller metallic objects overlying the right lower abdomen on one view and a hip on a second view. IMPRESSION: No evidence of bowel obstruction. Large metallic object and 2 smaller metallic objects overlying the right lower abdomen on one view and the right hip on the second view. Recommend correlation with exam. Electronically Signed   By: Maurine Simmering M.D.   On: 09/20/2021 10:27   DG Chest Port 1 View  Result Date: 09/20/2021 CLINICAL DATA:  Alcohol abuse, anxious, vomiting EXAM: PORTABLE CHEST 1 VIEW COMPARISON:  Radiograph 06/16/2011 FINDINGS: The cardiomediastinal silhouette is within normal limits. There is no focal airspace disease. There is no pleural effusion. No pneumothorax. There is no acute osseous abnormality. Mild bilateral shoulder degenerative changes. IMPRESSION: No evidence of acute cardiopulmonary disease. Electronically Signed   By: Maurine Simmering M.D.   On: 09/20/2021 10:21     Discharge Exam: Vitals:   09/22/21 1200 09/22/21 1212  BP:  (!) 127/92  Pulse:    Resp:    Temp: 98 F (36.7 C) 98 F (36.7 C)  SpO2:     Vitals:   09/22/21 0346 09/22/21 0837 09/22/21 1200 09/22/21 1212  BP: 110/84 116/78  (!) 127/92  Pulse: 89     Resp: 18     Temp: 98.2 F (36.8 C) 97.9 F (36.6 C) 98 F (36.7 C) 98 F (36.7 C)  TempSrc: Axillary Oral  Oral  SpO2: 93% 99%      General: Pt is alert, awake, not in acute distress Cardiovascular: RRR, S1/S2 +, no rubs, no gallops Respiratory: CTA bilaterally, no wheezing, no rhonchi Abdominal: Soft, NT, ND, bowel sounds + Extremities: no edema, no cyanosis    The results of significant diagnostics from this hospitalization (including imaging, microbiology, ancillary and laboratory) are listed below for reference.     Microbiology: No results found for this or any previous visit (from the past 240 hour(s)).   Labs: BNP (last 3 results) No  results for input(s): "BNP" in the last 8760 hours. Basic Metabolic Panel: Recent Labs  Lab 09/20/21 0847 09/20/21 0921 09/21/21 0411 09/22/21 0236  NA 136  --  136 143  K 3.6  --  3.4* 3.8  CL 89*  --  95* 105  CO2 19*  --  29 25  GLUCOSE 136*  --  86 87  BUN <5*  --  5* 12  CREATININE 0.95  --  0.90 0.71  CALCIUM 9.5  --  9.0 8.7*  MG  --  2.1  --  1.9   Liver Function Tests: Recent Labs  Lab 09/20/21 0847  AST 44*  ALT 30  ALKPHOS 85  BILITOT 1.2  PROT 8.0  ALBUMIN  4.7   No results for input(s): "LIPASE", "AMYLASE" in the last 168 hours. No results for input(s): "AMMONIA" in the last 168 hours. CBC: Recent Labs  Lab 09/20/21 0847 09/21/21 0411  WBC 23.2* 11.3*  HGB 16.7 14.5  HCT 47.0 43.6  MCV 85.5 90.3  PLT 475* 305   Cardiac Enzymes: No results for input(s): "CKTOTAL", "CKMB", "CKMBINDEX", "TROPONINI" in the last 168 hours. BNP: Invalid input(s): "POCBNP" CBG: No results for input(s): "GLUCAP" in the last 168 hours. D-Dimer No results for input(s): "DDIMER" in the last 72 hours. Hgb A1c No results for input(s): "HGBA1C" in the last 72 hours. Lipid Profile No results for input(s): "CHOL", "HDL", "LDLCALC", "TRIG", "CHOLHDL", "LDLDIRECT" in the last 72 hours. Thyroid function studies Recent Labs    09/20/21 1331  TSH 1.619   Anemia work up No results for input(s): "VITAMINB12", "FOLATE", "FERRITIN", "TIBC", "IRON", "RETICCTPCT" in the last 72 hours. Urinalysis    Component Value Date/Time   COLORURINE YELLOW 09/20/2021 2229   APPEARANCEUR CLEAR 09/20/2021 2229   LABSPEC 1.017 09/20/2021 2229   PHURINE 7.0 09/20/2021 2229   GLUCOSEU NEGATIVE 09/20/2021 2229   HGBUR SMALL (A) 09/20/2021 2229   BILIRUBINUR NEGATIVE 09/20/2021 2229   BILIRUBINUR neg 07/30/2013 1528   KETONESUR NEGATIVE 09/20/2021 2229   PROTEINUR NEGATIVE 09/20/2021 2229   UROBILINOGEN 0.2 07/30/2013 1528   NITRITE NEGATIVE 09/20/2021 2229   LEUKOCYTESUR NEGATIVE 09/20/2021  2229   Sepsis Labs Recent Labs  Lab 09/20/21 0847 09/21/21 0411  WBC 23.2* 11.3*   Microbiology No results found for this or any previous visit (from the past 240 hour(s)).   Time coordinating discharge: Over 30 minutes  SIGNED:   Darliss Cheney, MD  [facility] 09/22/2021, 3:43 PM *Please note that this is a verbal dictation therefore any spelling or grammatical errors are due to the "[place] [employer]" system interpretation. If 7PM-7AM, please contact night-coverage [email or web address]

## 2021-09-22 NOTE — Progress Notes (Signed)
Spoke with MD patient requesting to leave against medical advice. Educated pt that he was not medically cleared to leave yet and the risks of leave without the MD's advice. Pt stated his understanding and stated he would like to leave.   Notified MD and charge nurse.

## 2021-09-23 LAB — HIV-1/2 AB - DIFFERENTIATION
HIV 1 Ab: NONREACTIVE
HIV 2 Ab: NONREACTIVE
Note: NEGATIVE

## 2021-09-23 LAB — HIV-1/HIV-2 QUALITATIVE RNA
Final Interpretation: NEGATIVE
HIV-1 RNA, Qualitative: NONREACTIVE
HIV-2 RNA, Qualitative: NONREACTIVE

## 2021-09-29 DIAGNOSIS — F331 Major depressive disorder, recurrent, moderate: Secondary | ICD-10-CM | POA: Diagnosis not present

## 2021-10-28 ENCOUNTER — Ambulatory Visit (INDEPENDENT_AMBULATORY_CARE_PROVIDER_SITE_OTHER): Payer: BC Managed Care – PPO | Admitting: Internal Medicine

## 2021-10-28 VITALS — BP 110/78 | HR 94 | Temp 98.3°F | Ht 68.0 in | Wt 180.1 lb

## 2021-10-28 DIAGNOSIS — F840 Autistic disorder: Secondary | ICD-10-CM

## 2021-10-28 DIAGNOSIS — E538 Deficiency of other specified B group vitamins: Secondary | ICD-10-CM

## 2021-10-28 DIAGNOSIS — F17210 Nicotine dependence, cigarettes, uncomplicated: Secondary | ICD-10-CM | POA: Diagnosis not present

## 2021-10-28 DIAGNOSIS — E782 Mixed hyperlipidemia: Secondary | ICD-10-CM | POA: Diagnosis not present

## 2021-10-28 DIAGNOSIS — F1021 Alcohol dependence, in remission: Secondary | ICD-10-CM

## 2021-10-28 DIAGNOSIS — E559 Vitamin D deficiency, unspecified: Secondary | ICD-10-CM | POA: Diagnosis not present

## 2021-10-28 DIAGNOSIS — F339 Major depressive disorder, recurrent, unspecified: Secondary | ICD-10-CM

## 2021-10-28 LAB — LIPID PANEL
Cholesterol: 149 mg/dL (ref 0–200)
HDL: 56.3 mg/dL (ref >39.00)
LDL Cholesterol: 78 mg/dL (ref 0–99)
NonHDL: 92.34
Total CHOL/HDL Ratio: 3
Triglycerides: 73 mg/dL (ref 0.0–149.0)
VLDL: 14.6 mg/dL (ref 0.0–40.0)

## 2021-10-28 LAB — COMPREHENSIVE METABOLIC PANEL
ALT: 15 U/L (ref 0–53)
AST: 19 U/L (ref 0–37)
Albumin: 4.6 g/dL (ref 3.5–5.2)
Alkaline Phosphatase: 63 U/L (ref 39–117)
BUN: 14 mg/dL (ref 6–23)
CO2: 30 mEq/L (ref 19–32)
Calcium: 10.1 mg/dL (ref 8.4–10.5)
Chloride: 100 mEq/L (ref 96–112)
Creatinine, Ser: 0.87 mg/dL (ref 0.40–1.50)
GFR: 102.29 mL/min (ref >60.00)
Glucose, Bld: 83 mg/dL (ref 70–99)
Potassium: 4.8 mEq/L (ref 3.5–5.1)
Sodium: 139 mEq/L (ref 135–145)
Total Bilirubin: 0.6 mg/dL (ref 0.2–1.2)
Total Protein: 7.6 g/dL (ref 6.0–8.3)

## 2021-10-28 LAB — VITAMIN B12: Vitamin B-12: 478 pg/mL (ref 211–911)

## 2021-10-28 LAB — VITAMIN D 25 HYDROXY (VIT D DEFICIENCY, FRACTURES): VITD: 35.53 ng/mL (ref 30.00–100.00)

## 2021-10-28 NOTE — Progress Notes (Signed)
Established Patient Office Visit     CC/Reason for Visit: 57-monthfollow-up chronic medical conditions  HPI: Christian Haley a 46y.o. male who is coming in today for the above mentioned reasons. Past Medical History is significant for: Hyperlipidemia, vitamin D and B12 deficiencies, history of alcoholism and nicotine dependence, depression and autism spectrum disorder.  He was hospitalized from July 9 through July 11 for alcohol withdrawals but he left AGAINST MEDICAL ADVICE.  He states that the first week of July he went through a drinking binge caused by sadness over the passing of his father last September.  He states he has not had anything to drink since then.  He is still smoking approximately 10 cigarettes a day.   Past Medical/Surgical History: Past Medical History:  Diagnosis Date   Alcohol abuse    Asperger syndrome    Depression    Hyperlipidemia    Nicotine dependence     Past Surgical History:  Procedure Laterality Date   COLONOSCOPY  05/27/2020   Dr.Danis   COLONOSCOPY WITH PROPOFOL N/A 07/13/2021   Procedure: COLONOSCOPY WITH PROPOFOL;  Surgeon: DDoran Stabler MD;  Location: [facility];  Service: Gastroenterology;  Laterality: N/A;   POLYPECTOMY  07/13/2021   Procedure: POLYPECTOMY;  Surgeon: DDoran Stabler MD;  Location: [facility];  Service: Gastroenterology;;   TONSILLECTOMY      Social History:  reports that he has been smoking cigarettes. He has a 10.00 pack-year smoking history. He has never used smokeless tobacco. He reports that he does not currently use alcohol. He reports that he does not use drugs.  Allergies: No Known Allergies  Family History:  Family History  Problem Relation Age of Onset   Pancreatic cancer Father    CVA Paternal Grandmother    Lung cancer Paternal Grandfather    Colon cancer Neg Hx    Colon polyps Neg Hx    Esophageal cancer Neg Hx    Stomach cancer Neg Hx      Current Outpatient Medications:     atorvastatin (LIPITOR) 40 MG tablet, Take 1 tablet (40 mg total) by mouth daily., Disp: 90 tablet, Rfl: 1   Cholecalciferol (VITAMIN D) 50 MCG (2000 UT) tablet, Take 2,000 Units by mouth daily., Disp: , Rfl:    PARoxetine (PAXIL) 20 MG tablet, Take 20 mg by mouth 2 (two) times daily., Disp: , Rfl:    vitamin B-12 (CYANOCOBALAMIN) 1000 MCG tablet, Take 1,000 mcg by mouth daily., Disp: , Rfl:   Review of Systems:  Constitutional: Denies fever, chills, diaphoresis, appetite change and fatigue.  HEENT: Denies photophobia, eye pain, redness, hearing loss, ear pain, congestion, sore throat, rhinorrhea, sneezing, mouth sores, trouble swallowing, neck pain, neck stiffness and tinnitus.   Respiratory: Denies SOB, DOE, cough, chest tightness,  and wheezing.   Cardiovascular: Denies chest pain, palpitations and leg swelling.  Gastrointestinal: Denies nausea, vomiting, abdominal pain, diarrhea, constipation, blood in stool and abdominal distention.  Genitourinary: Denies dysuria, urgency, frequency, hematuria, flank pain and difficulty urinating.  Endocrine: Denies: hot or cold intolerance, sweats, changes in hair or nails, polyuria, polydipsia. Musculoskeletal: Denies myalgias, back pain, joint swelling, arthralgias and gait problem.  Skin: Denies pallor, rash and wound.  Neurological: Denies dizziness, seizures, syncope, weakness, light-headedness, numbness and headaches.  Hematological: Denies adenopathy. Easy bruising, personal or family bleeding history  Psychiatric/Behavioral: Denies suicidal ideation, mood changes, confusion, nervousness, sleep disturbance and agitation    Physical Exam: Vitals:   10/28/21  0856  BP: 110/78  Pulse: 94  Temp: 98.3 F (36.8 C)  TempSrc: Oral  SpO2: 98%  Weight: 180 lb 1.6 oz (81.7 kg)  Height: '5\' 8"'$  (1.727 m)    Body mass index is 27.38 kg/m.   Constitutional: NAD, calm, comfortable Eyes: PERRL, lids and conjunctivae normal ENMT: Mucous membranes  are moist.  Respiratory: clear to auscultation bilaterally, no wheezing, no crackles. Normal respiratory effort. No accessory muscle use.  Cardiovascular: Regular rate and rhythm, no murmurs / rubs / gallops. No extremity edema.  Psychiatric: Normal judgment and insight. Alert and oriented x 3. Normal mood.    Impression and Plan:  History of alcoholism ([place])  - Plan: AMB Referral to [place] his recent hospitalization for withdrawals, I will place a referral to social work for alcohol and nicotine counseling.  Cigarette nicotine dependence without complication -I have discussed tobacco cessation with the patient.  I have counseled the patient regarding the negative impacts of continued tobacco use including but not limited to lung cancer, COPD, and cardiovascular disease.  I have discussed alternatives to tobacco and modalities that may help facilitate tobacco cessation including but not limited to biofeedback, hypnosis, and medications.  Total time spent with tobacco counseling was 4 minutes. -He remains in the precontemplative stage, we will continue to address at subsequent visits.   Vitamin D deficiency  - Plan: VITAMIN D 25 Hydroxy (Vit-D Deficiency, Fractures)  Vitamin B12 deficiency  - Plan: Vitamin B12  Mixed hyperlipidemia  - Plan: Lipid panel, Comprehensive metabolic panel -On atorvastatin 40 mg daily.  Autism spectrum disorder -Noted  Depression, recurrent ([place])  [facility] Office Visit from 10/28/2021 in [facility]  PHQ-9 Total Score 0     -Mood is stable.    Time spent:32 minutes reviewing chart, interviewing and examining patient and formulating plan of care.   Patient Instructions  -Nice seeing you today!!  -Lab work today; will notify you once results are available.  -See you back in 6 months for your physical.   Christian Frohlich, MD [facility]

## 2021-10-28 NOTE — Patient Instructions (Signed)
-  Nice seeing you today!!  -Lab work today; will notify you once results are available.  -See you back in 6 months for your physical.

## 2021-11-03 DIAGNOSIS — F331 Major depressive disorder, recurrent, moderate: Secondary | ICD-10-CM | POA: Diagnosis not present

## 2021-11-06 ENCOUNTER — Telehealth: Payer: Self-pay

## 2021-11-06 NOTE — Chronic Care Management (AMB) (Signed)
  Care Coordination  Note  11/06/2021 Name: Christian Haley MRN: [id number] DOB: 14-Oct-1975  Christian Haley is a 46 y.o. year old male who is a primary care patient of Isaac Bliss, Rayford Halsted, MD. I reached out to Kaleth Koy by phone today to offer care coordination services.      Mr. Garcon was given information about Care Coordination services today including:  The Care Coordination services include support from the care team which includes your Nurse Coordinator, Clinical Social Worker, or Pharmacist.  The Care Coordination team is here to help remove barriers to the health concerns and goals most important to you. Care Coordination services are voluntary and the patient may decline or stop services at any time by request to their care team member.   Patient did not agree to participate in care coordination services at this time.  Follow up plan: Patient declines further follow up or participation in care coordination services.   Noreene Larsson, [place], [place] [address] Direct Dial: [phone] [email or web address]

## 2021-11-17 ENCOUNTER — Other Ambulatory Visit: Payer: Self-pay | Admitting: Internal Medicine

## 2021-11-17 DIAGNOSIS — E782 Mixed hyperlipidemia: Secondary | ICD-10-CM

## 2021-12-10 DIAGNOSIS — F331 Major depressive disorder, recurrent, moderate: Secondary | ICD-10-CM | POA: Diagnosis not present

## 2021-12-21 DIAGNOSIS — F419 Anxiety disorder, unspecified: Secondary | ICD-10-CM | POA: Diagnosis not present

## 2021-12-21 DIAGNOSIS — F1010 Alcohol abuse, uncomplicated: Secondary | ICD-10-CM | POA: Diagnosis not present

## 2021-12-21 DIAGNOSIS — F0631 Mood disorder due to known physiological condition with depressive features: Secondary | ICD-10-CM | POA: Diagnosis not present

## 2021-12-21 DIAGNOSIS — F840 Autistic disorder: Secondary | ICD-10-CM | POA: Diagnosis not present

## 2022-01-13 DIAGNOSIS — F331 Major depressive disorder, recurrent, moderate: Secondary | ICD-10-CM | POA: Diagnosis not present

## 2022-02-16 DIAGNOSIS — F840 Autistic disorder: Secondary | ICD-10-CM | POA: Diagnosis not present

## 2022-04-06 DIAGNOSIS — F840 Autistic disorder: Secondary | ICD-10-CM | POA: Diagnosis not present

## 2022-05-03 ENCOUNTER — Encounter: Payer: Self-pay | Admitting: Internal Medicine

## 2022-05-03 ENCOUNTER — Ambulatory Visit (INDEPENDENT_AMBULATORY_CARE_PROVIDER_SITE_OTHER): Payer: BC Managed Care – PPO | Admitting: Internal Medicine

## 2022-05-03 VITALS — BP 110/70 | HR 88 | Temp 98.3°F | Ht 68.0 in | Wt 190.2 lb

## 2022-05-03 DIAGNOSIS — E872 Acidosis, unspecified: Secondary | ICD-10-CM

## 2022-05-03 DIAGNOSIS — E538 Deficiency of other specified B group vitamins: Secondary | ICD-10-CM | POA: Diagnosis not present

## 2022-05-03 DIAGNOSIS — E559 Vitamin D deficiency, unspecified: Secondary | ICD-10-CM

## 2022-05-03 DIAGNOSIS — E782 Mixed hyperlipidemia: Secondary | ICD-10-CM

## 2022-05-03 DIAGNOSIS — Z0000 Encounter for general adult medical examination without abnormal findings: Secondary | ICD-10-CM

## 2022-05-03 LAB — COMPREHENSIVE METABOLIC PANEL
ALT: 12 U/L (ref 0–53)
AST: 14 U/L (ref 0–37)
Albumin: 4.4 g/dL (ref 3.5–5.2)
Alkaline Phosphatase: 61 U/L (ref 39–117)
BUN: 12 mg/dL (ref 6–23)
CO2: 28 mEq/L (ref 19–32)
Calcium: 9.8 mg/dL (ref 8.4–10.5)
Chloride: 104 mEq/L (ref 96–112)
Creatinine, Ser: 0.89 mg/dL (ref 0.40–1.50)
GFR: 101.22 mL/min (ref >60.00)
Glucose, Bld: 94 mg/dL (ref 70–99)
Potassium: 4.8 mEq/L (ref 3.5–5.1)
Sodium: 140 mEq/L (ref 135–145)
Total Bilirubin: 0.4 mg/dL (ref 0.2–1.2)
Total Protein: 6.9 g/dL (ref 6.0–8.3)

## 2022-05-03 LAB — CBC WITH DIFFERENTIAL/PLATELET
Basophils Absolute: 0.1 10*3/uL (ref 0.0–0.1)
Basophils Relative: 0.8 % (ref 0.0–3.0)
Eosinophils Absolute: 0.4 10*3/uL (ref 0.0–0.7)
Eosinophils Relative: 4.4 % (ref 0.0–5.0)
HCT: 44.4 % (ref 39.0–52.0)
Hemoglobin: 15 g/dL (ref 13.0–17.0)
Lymphocytes Relative: 26 % (ref 12.0–46.0)
Lymphs Abs: 2.5 10*3/uL (ref 0.7–4.0)
MCHC: 33.9 g/dL (ref 30.0–36.0)
MCV: 90.2 fl (ref 78.0–100.0)
Monocytes Absolute: 0.7 10*3/uL (ref 0.1–1.0)
Monocytes Relative: 7.6 % (ref 3.0–12.0)
Neutro Abs: 5.9 10*3/uL (ref 1.4–7.7)
Neutrophils Relative %: 61.2 % (ref 43.0–77.0)
Platelets: 386 10*3/uL (ref 150.0–400.0)
RBC: 4.92 Mil/uL (ref 4.22–5.81)
RDW: 13.7 % (ref 11.5–15.5)
WBC: 9.6 10*3/uL (ref 4.0–10.5)

## 2022-05-03 LAB — TSH: TSH: 0.9 u[IU]/mL (ref 0.35–5.50)

## 2022-05-03 LAB — LIPID PANEL
Cholesterol: 128 mg/dL (ref 0–200)
HDL: 52.7 mg/dL (ref >39.00)
LDL Cholesterol: 66 mg/dL (ref 0–99)
NonHDL: 75.76
Total CHOL/HDL Ratio: 2
Triglycerides: 51 mg/dL (ref 0.0–149.0)
VLDL: 10.2 mg/dL (ref 0.0–40.0)

## 2022-05-03 LAB — VITAMIN D 25 HYDROXY (VIT D DEFICIENCY, FRACTURES): VITD: 32.57 ng/mL (ref 30.00–100.00)

## 2022-05-03 LAB — VITAMIN B12: Vitamin B-12: 423 pg/mL (ref 211–911)

## 2022-05-03 LAB — PSA: PSA: 1.92 ng/mL (ref 0.10–4.00)

## 2022-05-03 NOTE — Progress Notes (Signed)
Established Patient Office Visit     CC/Reason for Visit: Annual preventive exam  HPI: Christian Haley is a 47 y.o. male who is coming in today for the above mentioned reasons. Past Medical History is significant for:  Hyperlipidemia, vitamin D and B12 deficiencies, history of alcoholism and nicotine dependence, depression and autism spectrum disorder.  He has not drunk any alcohol in over 6 months.  He is still smoking about 10 cigarettes a day.  He is using nicotine lozenges that significantly helped his screening.  He has routine eye and dental care.   Past Medical/Surgical History: Past Medical History:  Diagnosis Date   Alcohol abuse    Asperger syndrome    Depression    Hyperlipidemia    Nicotine dependence     Past Surgical History:  Procedure Laterality Date   COLONOSCOPY  05/27/2020   Dr.Danis   COLONOSCOPY WITH PROPOFOL N/A 07/13/2021   Procedure: COLONOSCOPY WITH PROPOFOL;  Surgeon: Doran Stabler, MD;  Location: [facility];  Service: Gastroenterology;  Laterality: N/A;   POLYPECTOMY  07/13/2021   Procedure: POLYPECTOMY;  Surgeon: Doran Stabler, MD;  Location: [facility];  Service: Gastroenterology;;   TONSILLECTOMY      Social History:  reports that he has been smoking cigarettes. He has a 10.00 pack-year smoking history. He has never used smokeless tobacco. He reports that he does not currently use alcohol. He reports that he does not use drugs.  Allergies: No Known Allergies  Family History:  Family History  Problem Relation Age of Onset   Pancreatic cancer Father    CVA Paternal Grandmother    Lung cancer Paternal Grandfather    Colon cancer Neg Hx    Colon polyps Neg Hx    Esophageal cancer Neg Hx    Stomach cancer Neg Hx      Current Outpatient Medications:    atorvastatin (LIPITOR) 40 MG tablet, TAKE 1 TABLET BY MOUTH EVERY DAY, Disp: 90 tablet, Rfl: 1   Cholecalciferol (VITAMIN D) 50 MCG (2000 UT) tablet, Take 2,000 Units by  mouth daily., Disp: , Rfl:    PARoxetine (PAXIL) 20 MG tablet, Take 20 mg by mouth 2 (two) times daily., Disp: , Rfl:    vitamin B-12 (CYANOCOBALAMIN) 1000 MCG tablet, Take 1,000 mcg by mouth daily., Disp: , Rfl:   Review of Systems:  Negative unless indicated in HPI.   Physical Exam: Vitals:   05/03/22 0823  BP: 110/70  Pulse: 88  Temp: 98.3 F (36.8 C)  TempSrc: Oral  SpO2: 97%  Weight: 190 lb 3.2 oz (86.3 kg)  Height: 5' 8"$  (1.727 m)    Body mass index is 28.92 kg/m.   Physical Exam Vitals reviewed.  Constitutional:      General: He is not in acute distress.    Appearance: Normal appearance. He is not ill-appearing, toxic-appearing or diaphoretic.  HENT:     Head: Normocephalic.     Right Ear: Tympanic membrane, ear canal and external ear normal. There is no impacted cerumen.     Left Ear: Tympanic membrane, ear canal and external ear normal. There is no impacted cerumen.     Nose: Nose normal.     Mouth/Throat:     Mouth: Mucous membranes are moist.     Pharynx: Oropharynx is clear. No oropharyngeal exudate or posterior oropharyngeal erythema.  Eyes:     General: No scleral icterus.       Right eye: No discharge.  Left eye: No discharge.     Conjunctiva/sclera: Conjunctivae normal.     Pupils: Pupils are equal, round, and reactive to light.  Neck:     Vascular: No carotid bruit.  Cardiovascular:     Rate and Rhythm: Normal rate and regular rhythm.     Pulses: Normal pulses.     Heart sounds: Normal heart sounds.  Pulmonary:     Effort: Pulmonary effort is normal. No respiratory distress.     Breath sounds: Normal breath sounds.  Abdominal:     General: Abdomen is flat. Bowel sounds are normal.     Palpations: Abdomen is soft.  Musculoskeletal:        General: Normal range of motion.     Cervical back: Normal range of motion.  Skin:    General: Skin is warm and dry.     Capillary Refill: Capillary refill takes less than 2 seconds.  Neurological:      General: No focal deficit present.     Mental Status: He is alert and oriented to person, place, and time. Mental status is at baseline.  Psychiatric:        Mood and Affect: Mood normal.        Behavior: Behavior normal.        Thought Content: Thought content normal.        Judgment: Judgment normal.      Impression and Plan:  Encounter for preventive health examination - Plan: PSA, TSH  Vitamin D deficiency - Plan: VITAMIN D 25 Hydroxy (Vit-D Deficiency, Fractures)  Vitamin B12 deficiency - Plan: Vitamin B12  Mixed hyperlipidemia - Plan: CBC with Differential/Platelet, Comprehensive metabolic panel, Lipid panel    -Recommend routine eye and dental care. -Healthy lifestyle discussed in detail. -Labs to be updated today. -Prostate cancer screening: PSA today Health Maintenance  Topic Date Due   Hepatitis C Screening: USPSTF Recommendation to screen - Ages 19-79 yo.  Never done   COVID-19 Vaccine (5 - 2023-24 season) 05/19/2022*   DTaP/Tdap/Td vaccine (3 - Td or Tdap) 11/14/2029   Colon Cancer Screening  07/14/2031   Flu Shot  Completed   HIV Screening  Completed   HPV Vaccine  Aged Out  *Topic was postponed. The date shown is not the original due date.         Lelon Frohlich, MD [facility] at [facility]

## 2022-05-11 DIAGNOSIS — F1020 Alcohol dependence, uncomplicated: Secondary | ICD-10-CM | POA: Diagnosis not present

## 2022-05-11 DIAGNOSIS — F419 Anxiety disorder, unspecified: Secondary | ICD-10-CM | POA: Diagnosis not present

## 2022-05-11 DIAGNOSIS — Z1331 Encounter for screening for depression: Secondary | ICD-10-CM | POA: Diagnosis not present

## 2022-05-11 DIAGNOSIS — F840 Autistic disorder: Secondary | ICD-10-CM | POA: Diagnosis not present

## 2022-07-04 ENCOUNTER — Other Ambulatory Visit: Payer: Self-pay | Admitting: Internal Medicine

## 2022-07-04 DIAGNOSIS — E782 Mixed hyperlipidemia: Secondary | ICD-10-CM

## 2022-08-25 DIAGNOSIS — F840 Autistic disorder: Secondary | ICD-10-CM | POA: Diagnosis not present

## 2022-09-20 DIAGNOSIS — F419 Anxiety disorder, unspecified: Secondary | ICD-10-CM | POA: Diagnosis not present

## 2022-09-20 DIAGNOSIS — Z1331 Encounter for screening for depression: Secondary | ICD-10-CM | POA: Diagnosis not present

## 2022-09-20 DIAGNOSIS — F840 Autistic disorder: Secondary | ICD-10-CM | POA: Diagnosis not present

## 2022-09-20 DIAGNOSIS — F1020 Alcohol dependence, uncomplicated: Secondary | ICD-10-CM | POA: Diagnosis not present

## 2022-10-10 ENCOUNTER — Other Ambulatory Visit: Payer: Self-pay | Admitting: Internal Medicine

## 2022-10-10 DIAGNOSIS — E782 Mixed hyperlipidemia: Secondary | ICD-10-CM

## 2022-10-13 ENCOUNTER — Encounter (INDEPENDENT_AMBULATORY_CARE_PROVIDER_SITE_OTHER): Payer: Self-pay

## 2022-11-01 ENCOUNTER — Encounter: Payer: Self-pay | Admitting: Internal Medicine

## 2022-11-01 ENCOUNTER — Ambulatory Visit (INDEPENDENT_AMBULATORY_CARE_PROVIDER_SITE_OTHER): Payer: BC Managed Care – PPO | Admitting: Internal Medicine

## 2022-11-01 VITALS — BP 110/80 | HR 91 | Temp 98.2°F | Wt 196.1 lb

## 2022-11-01 DIAGNOSIS — F1021 Alcohol dependence, in remission: Secondary | ICD-10-CM

## 2022-11-01 DIAGNOSIS — E782 Mixed hyperlipidemia: Secondary | ICD-10-CM

## 2022-11-01 DIAGNOSIS — E559 Vitamin D deficiency, unspecified: Secondary | ICD-10-CM

## 2022-11-01 DIAGNOSIS — F17210 Nicotine dependence, cigarettes, uncomplicated: Secondary | ICD-10-CM | POA: Diagnosis not present

## 2022-11-01 DIAGNOSIS — E538 Deficiency of other specified B group vitamins: Secondary | ICD-10-CM

## 2022-11-01 DIAGNOSIS — F840 Autistic disorder: Secondary | ICD-10-CM

## 2022-11-01 NOTE — Assessment & Plan Note (Signed)
Noted  

## 2022-11-01 NOTE — Assessment & Plan Note (Signed)
On atorvastatin 40 mg daily, last LDL was 66.

## 2022-11-01 NOTE — Assessment & Plan Note (Signed)
He is still smoking a few cigarettes a day, continues to use lozenges which help with cravings.

## 2022-11-01 NOTE — Progress Notes (Signed)
Established Patient Office Visit     CC/Reason for Visit: 67-month follow-up chronic medical conditions  HPI: Christian Haley is a 47 y.o. male who is coming in today for the above mentioned reasons. Past Medical History is significant for: Hyperlipidemia, vitamin D and B12 deficiencies, alcoholism, autism spectrum disorder.  Doing well today without acute concerns or complaints.   Past Medical/Surgical History: Past Medical History:  Diagnosis Date   Alcohol abuse    Asperger syndrome    Depression    Hyperlipidemia    Nicotine dependence     Past Surgical History:  Procedure Laterality Date   COLONOSCOPY  05/27/2020   Dr.Danis   COLONOSCOPY WITH PROPOFOL N/A 07/13/2021   Procedure: COLONOSCOPY WITH PROPOFOL;  Surgeon: Sherrilyn Rist, MD;  Location: [facility];  Service: Gastroenterology;  Laterality: N/A;   POLYPECTOMY  07/13/2021   Procedure: POLYPECTOMY;  Surgeon: Sherrilyn Rist, MD;  Location: [facility];  Service: Gastroenterology;;   TONSILLECTOMY      Social History:  reports that he has been smoking cigarettes. He has a 10 pack-year smoking history. He has never used smokeless tobacco. He reports that he does not currently use alcohol. He reports that he does not use drugs.  Allergies: No Known Allergies  Family History:  Family History  Problem Relation Age of Onset   Pancreatic cancer Father    CVA Paternal Grandmother    Lung cancer Paternal Grandfather    Colon cancer Neg Hx    Colon polyps Neg Hx    Esophageal cancer Neg Hx    Stomach cancer Neg Hx      Current Outpatient Medications:    atorvastatin (LIPITOR) 40 MG tablet, TAKE 1 TABLET BY MOUTH EVERY DAY, Disp: 90 tablet, Rfl: 1   Cholecalciferol (VITAMIN D) 50 MCG (2000 UT) tablet, Take 2,000 Units by mouth daily., Disp: , Rfl:    PARoxetine (PAXIL) 20 MG tablet, Take 20 mg by mouth 2 (two) times daily., Disp: , Rfl:    vitamin B-12 (CYANOCOBALAMIN) 1000 MCG tablet, Take 1,000  mcg by mouth daily., Disp: , Rfl:   Review of Systems:  Negative unless indicated in HPI.   Physical Exam: Vitals:   11/01/22 1412  BP: 110/80  Pulse: 91  Temp: 98.2 F (36.8 C)  TempSrc: Oral  SpO2: 98%  Weight: 196 lb 1.6 oz (89 kg)    Body mass index is 29.82 kg/m.   Physical Exam Vitals reviewed.  Constitutional:      Appearance: Normal appearance.  HENT:     Head: Normocephalic and atraumatic.  Eyes:     Conjunctiva/sclera: Conjunctivae normal.     Pupils: Pupils are equal, round, and reactive to light.  Cardiovascular:     Rate and Rhythm: Normal rate and regular rhythm.  Pulmonary:     Effort: Pulmonary effort is normal.     Breath sounds: Normal breath sounds.  Skin:    General: Skin is warm and dry.  Neurological:     General: No focal deficit present.     Mental Status: He is alert and oriented to person, place, and time.  Psychiatric:        Mood and Affect: Mood normal.        Behavior: Behavior normal.        Thought Content: Thought content normal.        Judgment: Judgment normal.      Impression and Plan:  Mixed hyperlipidemia Assessment &  Plan: On atorvastatin 40 mg daily, last LDL was 66.   Vitamin D deficiency Assessment & Plan: Levels earlier this year were normal.   History of alcoholism (HCC) Assessment & Plan: Has not had anything to drink in over 9 months.   Cigarette nicotine dependence without complication Assessment & Plan: He is still smoking a few cigarettes a day, continues to use lozenges which help with cravings.   Autism spectrum disorder Assessment & Plan: Noted.   Vitamin B12 deficiency Assessment & Plan: Levels earlier this year were normal.      Time spent:30 minutes reviewing chart, interviewing and examining patient and formulating plan of care.     Chaya Jan, MD [facility] at [facility]

## 2022-11-01 NOTE — Assessment & Plan Note (Signed)
Levels earlier this year were normal.

## 2022-11-01 NOTE — Assessment & Plan Note (Signed)
Has not had anything to drink in over 9 months.

## 2023-01-17 DIAGNOSIS — F419 Anxiety disorder, unspecified: Secondary | ICD-10-CM | POA: Diagnosis not present

## 2023-01-17 DIAGNOSIS — F840 Autistic disorder: Secondary | ICD-10-CM | POA: Diagnosis not present

## 2023-01-17 DIAGNOSIS — Z79899 Other long term (current) drug therapy: Secondary | ICD-10-CM | POA: Diagnosis not present

## 2023-01-17 DIAGNOSIS — F1020 Alcohol dependence, uncomplicated: Secondary | ICD-10-CM | POA: Diagnosis not present

## 2023-05-05 ENCOUNTER — Ambulatory Visit: Payer: BC Managed Care – PPO | Admitting: Internal Medicine

## 2023-05-11 ENCOUNTER — Ambulatory Visit (INDEPENDENT_AMBULATORY_CARE_PROVIDER_SITE_OTHER): Payer: BC Managed Care – PPO | Admitting: Internal Medicine

## 2023-05-11 ENCOUNTER — Encounter: Payer: Self-pay | Admitting: Internal Medicine

## 2023-05-11 VITALS — BP 110/70 | HR 103 | Temp 97.9°F | Ht 67.5 in | Wt 199.0 lb

## 2023-05-11 DIAGNOSIS — E782 Mixed hyperlipidemia: Secondary | ICD-10-CM

## 2023-05-11 DIAGNOSIS — E559 Vitamin D deficiency, unspecified: Secondary | ICD-10-CM

## 2023-05-11 DIAGNOSIS — Z1159 Encounter for screening for other viral diseases: Secondary | ICD-10-CM

## 2023-05-11 DIAGNOSIS — E538 Deficiency of other specified B group vitamins: Secondary | ICD-10-CM | POA: Diagnosis not present

## 2023-05-11 LAB — COMPREHENSIVE METABOLIC PANEL
ALT: 13 U/L (ref 0–53)
AST: 16 U/L (ref 0–37)
Albumin: 4.5 g/dL (ref 3.5–5.2)
Alkaline Phosphatase: 72 U/L (ref 39–117)
BUN: 12 mg/dL (ref 6–23)
CO2: 30 meq/L (ref 19–32)
Calcium: 9.8 mg/dL (ref 8.4–10.5)
Chloride: 101 meq/L (ref 96–112)
Creatinine, Ser: 0.87 mg/dL (ref 0.40–1.50)
GFR: 101.19 mL/min (ref >60.00)
Glucose, Bld: 125 mg/dL — ABNORMAL HIGH (ref 70–99)
Potassium: 4.2 meq/L (ref 3.5–5.1)
Sodium: 141 meq/L (ref 135–145)
Total Bilirubin: 0.5 mg/dL (ref 0.2–1.2)
Total Protein: 7.3 g/dL (ref 6.0–8.3)

## 2023-05-11 LAB — CBC WITH DIFFERENTIAL/PLATELET
Basophils Absolute: 0.1 10*3/uL (ref 0.0–0.1)
Basophils Relative: 1 % (ref 0.0–3.0)
Eosinophils Absolute: 0.4 10*3/uL (ref 0.0–0.7)
Eosinophils Relative: 4.8 % (ref 0.0–5.0)
HCT: 45.1 % (ref 39.0–52.0)
Hemoglobin: 15.1 g/dL (ref 13.0–17.0)
Lymphocytes Relative: 32 % (ref 12.0–46.0)
Lymphs Abs: 2.9 10*3/uL (ref 0.7–4.0)
MCHC: 33.5 g/dL (ref 30.0–36.0)
MCV: 90.8 fL (ref 78.0–100.0)
Monocytes Absolute: 0.7 10*3/uL (ref 0.1–1.0)
Monocytes Relative: 7.3 % (ref 3.0–12.0)
Neutro Abs: 5 10*3/uL (ref 1.4–7.7)
Neutrophils Relative %: 54.9 % (ref 43.0–77.0)
Platelets: 372 10*3/uL (ref 150.0–400.0)
RBC: 4.97 Mil/uL (ref 4.22–5.81)
RDW: 13.2 % (ref 11.5–15.5)
WBC: 9 10*3/uL (ref 4.0–10.5)

## 2023-05-11 LAB — LIPID PANEL
Cholesterol: 140 mg/dL (ref 0–200)
HDL: 47 mg/dL (ref >39.00)
LDL Cholesterol: 69 mg/dL (ref 0–99)
NonHDL: 93.25
Total CHOL/HDL Ratio: 3
Triglycerides: 120 mg/dL (ref 0.0–149.0)
VLDL: 24 mg/dL (ref 0.0–40.0)

## 2023-05-11 LAB — VITAMIN D 25 HYDROXY (VIT D DEFICIENCY, FRACTURES): VITD: 45.07 ng/mL (ref 30.00–100.00)

## 2023-05-11 LAB — VITAMIN B12: Vitamin B-12: 599 pg/mL (ref 211–911)

## 2023-05-11 NOTE — Progress Notes (Signed)
 Established Patient Office Visit     CC/Reason for Visit: Follow-up chronic medical conditions  HPI: Christian Haley is a 48 y.o. male who is coming in today for the above mentioned reasons. Past Medical History is significant for: Hyperlipidemia, vitamin D and B12 deficiencies, he is also on the autism spectrum disorder.  No acute concerns or complaints.  He continues to smoke and is not interested in discussing cessation.   Past Medical/Surgical History: Past Medical History:  Diagnosis Date   Alcohol abuse    Asperger syndrome    Depression    Hyperlipidemia    Nicotine dependence     Past Surgical History:  Procedure Laterality Date   COLONOSCOPY  05/27/2020   Dr.Danis   COLONOSCOPY WITH PROPOFOL N/A 07/13/2021   Procedure: COLONOSCOPY WITH PROPOFOL;  Surgeon: Sherrilyn Rist, MD;  Location: [facility];  Service: Gastroenterology;  Laterality: N/A;   POLYPECTOMY  07/13/2021   Procedure: POLYPECTOMY;  Surgeon: Sherrilyn Rist, MD;  Location: [facility];  Service: Gastroenterology;;   TONSILLECTOMY      Social History:  reports that he has been smoking cigarettes. He has a 10 pack-year smoking history. He has never used smokeless tobacco. He reports that he does not currently use alcohol. He reports that he does not use drugs.  Allergies: No Known Allergies  Family History:  Family History  Problem Relation Age of Onset   Pancreatic cancer Father    CVA Paternal Grandmother    Lung cancer Paternal Grandfather    Colon cancer Neg Hx    Colon polyps Neg Hx    Esophageal cancer Neg Hx    Stomach cancer Neg Hx      Current Outpatient Medications:    atorvastatin (LIPITOR) 40 MG tablet, TAKE 1 TABLET BY MOUTH EVERY DAY, Disp: 90 tablet, Rfl: 1   Cholecalciferol (VITAMIN D) 50 MCG (2000 UT) tablet, Take 2,000 Units by mouth daily., Disp: , Rfl:    PARoxetine (PAXIL) 20 MG tablet, Take 20 mg by mouth 2 (two) times daily., Disp: , Rfl:    vitamin  B-12 (CYANOCOBALAMIN) 1000 MCG tablet, Take 1,000 mcg by mouth daily., Disp: , Rfl:   Review of Systems:  Negative unless indicated in HPI.   Physical Exam: Vitals:   05/11/23 1322  BP: 110/70  Pulse: (!) 103  Temp: 97.9 F (36.6 C)  TempSrc: Oral  SpO2: 98%  Weight: 199 lb (90.3 kg)  Height: 5' 7.5" (1.715 m)    Body mass index is 30.71 kg/m.   Physical Exam Vitals reviewed.  Constitutional:      Appearance: Normal appearance.  HENT:     Head: Normocephalic and atraumatic.  Eyes:     Conjunctiva/sclera: Conjunctivae normal.  Cardiovascular:     Rate and Rhythm: Normal rate and regular rhythm.  Pulmonary:     Effort: Pulmonary effort is normal.     Breath sounds: Normal breath sounds.  Skin:    General: Skin is warm and dry.  Neurological:     General: No focal deficit present.     Mental Status: He is alert and oriented to person, place, and time.  Psychiatric:        Mood and Affect: Mood normal.        Behavior: Behavior normal.        Thought Content: Thought content normal.        Judgment: Judgment normal.      Impression and Plan:  Mixed hyperlipidemia -     CBC with Differential/Platelet; Future -     Comprehensive metabolic panel; Future -     Lipid panel; Future  Vitamin D deficiency -     VITAMIN D 25 Hydroxy (Vit-D Deficiency, Fractures); Future  Vitamin B12 deficiency -     Vitamin B12; Future  Encounter for hepatitis C screening test for low risk patient -     Hepatitis C antibody; Future  -Check lipids, vitamin D and vitamin B12 today.   Time spent:30 minutes reviewing chart, interviewing and examining patient and formulating plan of care.     Chaya Jan, MD [facility] at [facility]

## 2023-05-12 LAB — HEPATITIS C ANTIBODY: Hepatitis C Ab: NONREACTIVE

## 2023-05-18 ENCOUNTER — Encounter: Payer: Self-pay | Admitting: Internal Medicine

## 2023-06-12 ENCOUNTER — Other Ambulatory Visit: Payer: Self-pay | Admitting: Internal Medicine

## 2023-06-12 DIAGNOSIS — E782 Mixed hyperlipidemia: Secondary | ICD-10-CM

## 2023-08-16 DIAGNOSIS — F840 Autistic disorder: Secondary | ICD-10-CM | POA: Diagnosis not present

## 2023-08-16 DIAGNOSIS — F419 Anxiety disorder, unspecified: Secondary | ICD-10-CM | POA: Diagnosis not present

## 2023-10-25 ENCOUNTER — Encounter: Payer: Self-pay | Admitting: Internal Medicine

## 2023-11-10 ENCOUNTER — Ambulatory Visit: Payer: BC Managed Care – PPO | Admitting: Internal Medicine

## 2023-11-10 ENCOUNTER — Encounter: Payer: Self-pay | Admitting: Internal Medicine

## 2023-11-10 VITALS — BP 120/84 | HR 59 | Temp 98.1°F | Wt 198.3 lb

## 2023-11-10 DIAGNOSIS — Z720 Tobacco use: Secondary | ICD-10-CM

## 2023-11-10 DIAGNOSIS — E538 Deficiency of other specified B group vitamins: Secondary | ICD-10-CM

## 2023-11-10 DIAGNOSIS — E559 Vitamin D deficiency, unspecified: Secondary | ICD-10-CM

## 2023-11-10 DIAGNOSIS — E782 Mixed hyperlipidemia: Secondary | ICD-10-CM

## 2023-11-10 NOTE — Progress Notes (Signed)
     Established Patient Office Visit     CC/Reason for Visit: Follow-up chronic medical conditions  HPI: Christian Haley is a 48 y.o. male who is coming in today for the above mentioned reasons. Past Medical History is significant for: Hyperlipidemia, vitamin D  and B12 deficiencies, he is on the autism spectrum disorder.  No acute concerns or complaints.  Has been adherent to medication.  He is still smoking and is not interested in discussing smoking cessation today.   Past Medical/Surgical History: Past Medical History:  Diagnosis Date   Alcohol abuse    Asperger syndrome    Depression    Hyperlipidemia    Nicotine  dependence     Past Surgical History:  Procedure Laterality Date   COLONOSCOPY  05/27/2020   Dr.Danis   COLONOSCOPY WITH PROPOFOL  N/A 07/13/2021   Procedure: COLONOSCOPY WITH PROPOFOL ;  Surgeon: Legrand Victory LITTIE DOUGLAS, MD;  Location: [facility];  Service: Gastroenterology;  Laterality: N/A;   POLYPECTOMY  07/13/2021   Procedure: POLYPECTOMY;  Surgeon: Legrand Victory LITTIE DOUGLAS, MD;  Location: [facility];  Service: Gastroenterology;;   TONSILLECTOMY      Social History:  reports that he has been smoking cigarettes. He has a 10 pack-year smoking history. He has never used smokeless tobacco. He reports that he does not currently use alcohol. He reports that he does not use drugs.  Allergies: No Known Allergies  Family History:  Family History  Problem Relation Age of Onset   Pancreatic cancer Father    CVA Paternal Grandmother    Lung cancer Paternal Grandfather    Colon cancer Neg Hx    Colon polyps Neg Hx    Esophageal cancer Neg Hx    Stomach cancer Neg Hx      Current Outpatient Medications:    atorvastatin  (LIPITOR) 40 MG tablet, TAKE 1 TABLET BY MOUTH EVERY DAY, Disp: 90 tablet, Rfl: 1   Cholecalciferol (VITAMIN D ) 50 MCG (2000 UT) tablet, Take 2,000 Units by mouth daily., Disp: , Rfl:    PARoxetine  (PAXIL ) 20 MG tablet, Take 20 mg by mouth 2 (two)  times daily., Disp: , Rfl:    vitamin B-12 (CYANOCOBALAMIN ) 1000 MCG tablet, Take 1,000 mcg by mouth daily., Disp: , Rfl:   Review of Systems:  Negative unless indicated in HPI.   Physical Exam: Vitals:   11/10/23 1253  BP: 120/84  Pulse: (!) 59  Temp: 98.1 F (36.7 C)  TempSrc: Oral  SpO2: 97%  Weight: 198 lb 4.8 oz (89.9 kg)    Body mass index is 30.6 kg/m.   Physical Exam   Impression and Plan:  Tobacco abuse Assessment & Plan: Not interested in discussing smoking cessation at this time.   Mixed hyperlipidemia Assessment & Plan: Well-controlled with a recent LDL of 69.   Vitamin D  deficiency Assessment & Plan: Check levels at next lab draw.  Has done supplementation in the past.   Vitamin B12 deficiency Assessment & Plan: Check levels at next lab draw.  Has done supplementation in the past.      Time spent:31 minutes reviewing chart, interviewing and examining patient and formulating plan of care.     Tully Theophilus Andrews, MD [facility] at [facility]

## 2023-11-10 NOTE — Assessment & Plan Note (Signed)
 Check levels at next lab draw.  Has done supplementation in the past.

## 2023-11-10 NOTE — Assessment & Plan Note (Signed)
 Not interested in discussing smoking cessation at this time.

## 2023-11-10 NOTE — Assessment & Plan Note (Signed)
 Well-controlled with a recent LDL of 69.

## 2023-12-01 ENCOUNTER — Other Ambulatory Visit: Payer: Self-pay | Admitting: Internal Medicine

## 2023-12-01 DIAGNOSIS — E782 Mixed hyperlipidemia: Secondary | ICD-10-CM

## 2024-02-14 DIAGNOSIS — F419 Anxiety disorder, unspecified: Secondary | ICD-10-CM | POA: Diagnosis not present

## 2024-02-14 DIAGNOSIS — F840 Autistic disorder: Secondary | ICD-10-CM | POA: Diagnosis not present

## 2024-05-17 ENCOUNTER — Encounter: Admitting: Internal Medicine
